# Patient Record
Sex: Female | Born: 1965
Health system: Southern US, Community
[De-identification: ages and names within clinical notes are randomized; demographics above are authoritative.]

## PROBLEM LIST (undated history)

## (undated) DIAGNOSIS — Z9889 Other specified postprocedural states: Secondary | ICD-10-CM

## (undated) DIAGNOSIS — M797 Fibromyalgia: Secondary | ICD-10-CM

## (undated) DIAGNOSIS — D649 Anemia, unspecified: Secondary | ICD-10-CM

## (undated) DIAGNOSIS — Z973 Presence of spectacles and contact lenses: Secondary | ICD-10-CM

## (undated) DIAGNOSIS — M1990 Unspecified osteoarthritis, unspecified site: Secondary | ICD-10-CM

## (undated) DIAGNOSIS — C50919 Malignant neoplasm of unspecified site of unspecified female breast: Secondary | ICD-10-CM

## (undated) DIAGNOSIS — F419 Anxiety disorder, unspecified: Secondary | ICD-10-CM

## (undated) DIAGNOSIS — R112 Nausea with vomiting, unspecified: Secondary | ICD-10-CM

## (undated) DIAGNOSIS — K219 Gastro-esophageal reflux disease without esophagitis: Secondary | ICD-10-CM

## (undated) HISTORY — PX: TONSILLECTOMY: SUR1361

## (undated) HISTORY — DX: Malignant neoplasm of unspecified site of unspecified female breast: C50.919

## (undated) HISTORY — PX: TUBAL LIGATION: SHX77

---

## 1998-01-08 ENCOUNTER — Other Ambulatory Visit: Admission: RE | Admit: 1998-01-08 | Discharge: 1998-01-08 | Payer: Self-pay | Admitting: Obstetrics & Gynecology

## 1998-11-19 ENCOUNTER — Other Ambulatory Visit: Admission: RE | Admit: 1998-11-19 | Discharge: 1998-11-19 | Payer: Self-pay | Admitting: Obstetrics & Gynecology

## 1999-12-28 ENCOUNTER — Other Ambulatory Visit: Admission: RE | Admit: 1999-12-28 | Discharge: 1999-12-28 | Payer: Self-pay | Admitting: Obstetrics & Gynecology

## 2001-01-16 ENCOUNTER — Other Ambulatory Visit: Admission: RE | Admit: 2001-01-16 | Discharge: 2001-01-16 | Payer: Self-pay | Admitting: Obstetrics & Gynecology

## 2001-10-02 ENCOUNTER — Inpatient Hospital Stay (HOSPITAL_COMMUNITY): Admission: AD | Admit: 2001-10-02 | Discharge: 2001-10-05 | Payer: Self-pay | Admitting: Obstetrics and Gynecology

## 2001-10-08 ENCOUNTER — Inpatient Hospital Stay (HOSPITAL_COMMUNITY): Admission: AD | Admit: 2001-10-08 | Discharge: 2001-10-08 | Payer: Self-pay | Admitting: *Deleted

## 2001-11-06 ENCOUNTER — Other Ambulatory Visit: Admission: RE | Admit: 2001-11-06 | Discharge: 2001-11-06 | Payer: Self-pay | Admitting: Obstetrics & Gynecology

## 2002-11-21 ENCOUNTER — Other Ambulatory Visit: Admission: RE | Admit: 2002-11-21 | Discharge: 2002-11-21 | Payer: Self-pay | Admitting: Obstetrics & Gynecology

## 2003-12-16 ENCOUNTER — Other Ambulatory Visit: Admission: RE | Admit: 2003-12-16 | Discharge: 2003-12-16 | Payer: Self-pay | Admitting: Obstetrics & Gynecology

## 2005-01-06 ENCOUNTER — Other Ambulatory Visit: Admission: RE | Admit: 2005-01-06 | Discharge: 2005-01-06 | Payer: Self-pay | Admitting: Obstetrics & Gynecology

## 2006-08-24 ENCOUNTER — Ambulatory Visit (HOSPITAL_COMMUNITY): Admission: RE | Admit: 2006-08-24 | Discharge: 2006-08-24 | Payer: Self-pay | Admitting: Obstetrics & Gynecology

## 2014-02-04 DIAGNOSIS — C50212 Malignant neoplasm of upper-inner quadrant of left female breast: Secondary | ICD-10-CM | POA: Insufficient documentation

## 2014-02-04 DIAGNOSIS — C50919 Malignant neoplasm of unspecified site of unspecified female breast: Secondary | ICD-10-CM

## 2014-02-04 HISTORY — DX: Malignant neoplasm of unspecified site of unspecified female breast: C50.919

## 2014-02-14 ENCOUNTER — Other Ambulatory Visit: Payer: Self-pay | Admitting: Obstetrics & Gynecology

## 2014-02-14 DIAGNOSIS — R928 Other abnormal and inconclusive findings on diagnostic imaging of breast: Secondary | ICD-10-CM

## 2014-02-24 ENCOUNTER — Other Ambulatory Visit: Payer: Self-pay | Admitting: Obstetrics & Gynecology

## 2014-02-24 DIAGNOSIS — R928 Other abnormal and inconclusive findings on diagnostic imaging of breast: Secondary | ICD-10-CM

## 2014-02-25 ENCOUNTER — Ambulatory Visit
Admission: RE | Admit: 2014-02-25 | Discharge: 2014-02-25 | Disposition: A | Discharge status: Home / Self Care | Payer: Medicaid Other | Source: Ambulatory Visit | Attending: Obstetrics & Gynecology | Admitting: Obstetrics & Gynecology

## 2014-02-25 ENCOUNTER — Other Ambulatory Visit: Payer: Self-pay | Admitting: Obstetrics & Gynecology

## 2014-02-25 DIAGNOSIS — R928 Other abnormal and inconclusive findings on diagnostic imaging of breast: Secondary | ICD-10-CM

## 2014-02-26 ENCOUNTER — Ambulatory Visit
Admission: RE | Admit: 2014-02-26 | Discharge: 2014-02-26 | Disposition: A | Discharge status: Home / Self Care | Payer: Medicaid Other | Source: Ambulatory Visit | Attending: Obstetrics & Gynecology | Admitting: Obstetrics & Gynecology

## 2014-02-26 ENCOUNTER — Other Ambulatory Visit: Payer: Self-pay | Admitting: Obstetrics & Gynecology

## 2014-02-26 DIAGNOSIS — C50912 Malignant neoplasm of unspecified site of left female breast: Secondary | ICD-10-CM

## 2014-02-26 DIAGNOSIS — R928 Other abnormal and inconclusive findings on diagnostic imaging of breast: Secondary | ICD-10-CM

## 2014-02-28 ENCOUNTER — Other Ambulatory Visit (INDEPENDENT_AMBULATORY_CARE_PROVIDER_SITE_OTHER): Payer: Self-pay | Admitting: General Surgery

## 2014-02-28 ENCOUNTER — Other Ambulatory Visit: Payer: Self-pay

## 2014-02-28 DIAGNOSIS — C50912 Malignant neoplasm of unspecified site of left female breast: Secondary | ICD-10-CM

## 2014-03-04 ENCOUNTER — Ambulatory Visit
Admission: RE | Admit: 2014-03-04 | Discharge: 2014-03-04 | Disposition: A | Discharge status: Home / Self Care | Payer: Medicaid Other | Source: Ambulatory Visit | Attending: Obstetrics & Gynecology | Admitting: Obstetrics & Gynecology

## 2014-03-04 DIAGNOSIS — C50912 Malignant neoplasm of unspecified site of left female breast: Secondary | ICD-10-CM

## 2014-03-04 MED ORDER — GADOBENATE DIMEGLUMINE 529 MG/ML IV SOLN
10.0000 mL | Freq: Once | INTRAVENOUS | Status: AC | PRN
  Administered 2014-03-04: 10 mL via INTRAVENOUS

## 2014-03-05 ENCOUNTER — Other Ambulatory Visit: Payer: Self-pay | Admitting: Obstetrics & Gynecology

## 2014-03-05 DIAGNOSIS — R928 Other abnormal and inconclusive findings on diagnostic imaging of breast: Secondary | ICD-10-CM

## 2014-03-06 ENCOUNTER — Other Ambulatory Visit (INDEPENDENT_AMBULATORY_CARE_PROVIDER_SITE_OTHER): Payer: Self-pay | Admitting: General Surgery

## 2014-03-06 DIAGNOSIS — C50912 Malignant neoplasm of unspecified site of left female breast: Secondary | ICD-10-CM

## 2014-03-07 ENCOUNTER — Other Ambulatory Visit: Payer: Medicaid Other

## 2014-03-10 ENCOUNTER — Ambulatory Visit
Admission: RE | Admit: 2014-03-10 | Discharge: 2014-03-10 | Disposition: A | Discharge status: Home / Self Care | Payer: BC Managed Care – PPO | Source: Ambulatory Visit | Attending: Obstetrics & Gynecology | Admitting: Obstetrics & Gynecology

## 2014-03-10 ENCOUNTER — Ambulatory Visit
Admission: RE | Admit: 2014-03-10 | Discharge: 2014-03-10 | Disposition: A | Discharge status: Home / Self Care | Payer: Medicaid Other | Source: Ambulatory Visit | Attending: Obstetrics & Gynecology | Admitting: Obstetrics & Gynecology

## 2014-03-10 DIAGNOSIS — R928 Other abnormal and inconclusive findings on diagnostic imaging of breast: Secondary | ICD-10-CM

## 2014-03-10 MED ORDER — GADOBENATE DIMEGLUMINE 529 MG/ML IV SOLN
10.0000 mL | Freq: Once | INTRAVENOUS | Status: AC | PRN
  Administered 2014-03-10: 10 mL via INTRAVENOUS

## 2014-03-11 ENCOUNTER — Telehealth (INDEPENDENT_AMBULATORY_CARE_PROVIDER_SITE_OTHER): Payer: Self-pay

## 2014-03-11 NOTE — Telephone Encounter (Signed)
I called this pt. She is still trying to decided on lumpectomy X 2 or mastectomy. She would like you to call her so she can go over everything with you. If you try and find your office notes in allscripts she has a different last name. It is [name] in our system. Her number is [phone].

## 2014-03-14 ENCOUNTER — Other Ambulatory Visit (INDEPENDENT_AMBULATORY_CARE_PROVIDER_SITE_OTHER): Payer: Self-pay

## 2014-03-14 MED ORDER — TAMOXIFEN CITRATE 10 MG PO TABS: 10.0000 mg | ORAL_TABLET | Freq: Two times a day (BID) | ORAL | Status: DC

## 2014-03-19 ENCOUNTER — Encounter (HOSPITAL_BASED_OUTPATIENT_CLINIC_OR_DEPARTMENT_OTHER): Admission: RE | Payer: Self-pay | Source: Ambulatory Visit

## 2014-03-19 ENCOUNTER — Ambulatory Visit (HOSPITAL_BASED_OUTPATIENT_CLINIC_OR_DEPARTMENT_OTHER): Admission: RE | Admit: 2014-03-19 | Payer: Medicaid Other | Source: Ambulatory Visit | Admitting: General Surgery

## 2014-03-19 ENCOUNTER — Ambulatory Visit (HOSPITAL_COMMUNITY): Payer: Medicaid Other

## 2014-03-19 SURGERY — BREAST LUMPECTOMY WITH RADIOACTIVE SEED LOCALIZATION
Anesthesia: General | Laterality: Left

## 2014-03-21 ENCOUNTER — Telehealth: Payer: Self-pay | Admitting: Genetic Counselor

## 2014-03-21 NOTE — Telephone Encounter (Signed)
S/W PATIENT AND GAVE GENETIC APPT FOR 10/26 @ 2 W/KAREN POWELL.  REFERRING DR. TOTH DX- GENETIC

## 2014-03-28 ENCOUNTER — Other Ambulatory Visit (INDEPENDENT_AMBULATORY_CARE_PROVIDER_SITE_OTHER): Payer: Self-pay | Admitting: General Surgery

## 2014-03-28 DIAGNOSIS — C50912 Malignant neoplasm of unspecified site of left female breast: Secondary | ICD-10-CM

## 2014-03-31 ENCOUNTER — Encounter: Payer: Self-pay | Admitting: Genetic Counselor

## 2014-03-31 ENCOUNTER — Ambulatory Visit (HOSPITAL_BASED_OUTPATIENT_CLINIC_OR_DEPARTMENT_OTHER): Payer: BC Managed Care – PPO | Admitting: Genetic Counselor

## 2014-03-31 ENCOUNTER — Other Ambulatory Visit: Payer: BC Managed Care – PPO

## 2014-03-31 DIAGNOSIS — Z803 Family history of malignant neoplasm of breast: Secondary | ICD-10-CM

## 2014-03-31 DIAGNOSIS — Z315 Encounter for genetic counseling: Secondary | ICD-10-CM

## 2014-03-31 DIAGNOSIS — C50911 Malignant neoplasm of unspecified site of right female breast: Secondary | ICD-10-CM

## 2014-03-31 NOTE — Progress Notes (Signed)
Dr.  Marlou [name], Julie Hitch, MD requested a consultation for genetic counseling and risk assessment for Julie [name], a 48 y.o. female, for discussion of her personal and family history of breast cancer.  She presents to clinic today to discuss the possibility of a genetic predisposition to cancer, and to further clarify her risks, as well as her family members' risks for cancer.   HISTORY OF PRESENT ILLNESS: In September 2015, at the age of 53, Julie [name] was diagnosed with invasive ductal carcinoma of the right breast. The tumor is ER+/PR+/Her2-.  This will be treated with double mastectomy.    Past Medical History  Diagnosis Date  . Breast cancer 02/2014    ER+/PR+/Her2-    History reviewed. No pertinent past surgical history.  History   Social History  . Marital Status: Married    Spouse Name: N/A    Number of Children: 1  . Years of Education: N/A   Social History Main Topics  . Smoking status: Never Smoker   . Smokeless tobacco: None  . Alcohol Use: Yes     Comment: occ  . Drug Use: None  . Sexual Activity: Yes   Other Topics Concern  . None   Social History Narrative  . None    REPRODUCTIVE HISTORY AND PERSONAL RISK ASSESSMENT FACTORS: Menarche was at age 53.   premenopausal Uterus Intact: yes Ovaries Intact: yes G1P1A0, first live birth at age 38  She has not previously undergone treatment for infertility.   Oral Contraceptive use: 15 years   She has not used HRT in the past.    FAMILY HISTORY:  We obtained a detailed, 4-generation family history.  Significant diagnoses are listed below: Family History  Problem Relation Age of Onset  . Breast cancer Paternal Aunt     dx in her 25s  . Stroke Maternal Grandmother   . Bone cancer Maternal Grandfather   . Breast cancer Paternal Grandmother 54  . Stroke Paternal Grandfather   . Breast cancer Paternal Aunt     dx in her 87s    Patient's maternal ancestors are of Caucasian descent, and paternal  ancestors are of [place] descent. There is no reported Ashkenazi Jewish ancestry. There is no known consanguinity.  GENETIC COUNSELING ASSESSMENT: Julie [name] is a 48 y.o. female with a personal and family history of breast cancer which somewhat suggestive of a hereditary cancer syndrome and predisposition to cancer. We, therefore, discussed and recommended the following at today's visit.   DISCUSSION: We reviewed the characteristics, features and inheritance patterns of hereditary cancer syndromes. We also discussed genetic testing, including the appropriate family members to test, the process of testing, insurance coverage and turn-around-time for results. We reviewed her family history and discussed hereditary cancer syndromes.  We looked at different cancer panels based on more researched and listed in NCCN guidelines vs. Less researched and not listed in NCCN.  We reviewed pros and cons for limited testing vs. More testing.    In order to estimate her chance of having a BRCA mutation, we used statistical models ([place] II and [place]) and laboratory data that take into account her personal medical history, family history and ancestry.  Because each model is different, there can be a lot of variability in the risks they give.  Therefore, these numbers must be considered a rough range and not a precise risk of having a BRCA mutation.  These models estimate that she has approximately a 21% chance  of having a mutation. Based on this assessment of her family and personal history, genetic testing is recommended.  PLAN: After considering the risks, benefits, and limitations, Julie [name] provided informed consent to pursue genetic testing and the blood sample will be sent to [facility] for analysis of the Breast/Ovarian Cancer Syndrome. We discussed the implications of a positive, negative and/ or variant of uncertain significance genetic test result. Results should be available  within approximately 2-3 weeks' time, at which point they will be disclosed by telephone to Julie [name], as will any additional recommendations warranted by these results. Julie [name] will receive a summary of her genetic counseling visit and a copy of her results once available. This information will also be available in Epic. We encouraged Julie [name] to remain in contact with cancer genetics annually so that we can continuously update the family history and inform her of any changes in cancer genetics and testing that may be of benefit for her family. Julie [name]'s questions were answered to her satisfaction today. Our contact information was provided should additional questions or concerns arise.  The patient was seen for a total of 60 minutes, greater than 50% of which was spent face-to-face counseling.  This note will also be sent to the referring provider via the electronic medical record. The patient will be supplied with a summary of this genetic counseling discussion as well as educational information on the discussed hereditary cancer syndromes following the conclusion of their visit.     _______________________________________________________________________ For Office Staff:  Number of people involved in session: 2 Was an Intern/ student involved with case: no

## 2014-04-04 NOTE — H&P (Signed)
  Subjective:   Patient ID: Julie [name] is a 48 y.o. female.  HPI   Patient referred by Dr. Marlou Starks for evaluation for breast reconstruction. Patient presented following MMG with left breast 7 mm mass at 11 o clock, posterior third. [place] concordant and biospy demonstrated IDC. Underwent MRI and R breast benign, left with additional oval mass at 11 o clock middle third. Bx of second area IDC ER/PR+ Her 2 -. Patient initially scheduled for lumpectomy, this was canceled in part due to insurance issues. She has also had further dicussion with Dr. Marlou Starks regarding her family history breast cancer and plan at this time is bilateral mastectomies. Has genetic testing scheduled for next week  Current 36 C, deisred Full C  Weight fluctuates within 10 lb of current  Review of Systems  Musculoskeletal: Positive for myalgias and arthralgias.  Psychiatric/Behavioral: The patient is nervous/anxious.  All other systems reviewed and are negative.    Objective:   Physical Exam  Cardiovascular: Normal rate.  Pulmonary/Chest: Effort normal.  Abdominal: Soft.  Genitourinary: No breast discharge.  Grade 1 ptosis bilat SN to nipple R 24 cm L 24 cm BW R 14 L 15 cm Nipple to IMF R 8.5 L 8.5 cm  No axillary adenopathy  Skin:  Otho Ket 2    Assessment:    Left breast cancer   Plan:    Reviewed immediate vs delayed breast reconstruction, autologous vs implant based. Given the small amount abdominal tissue she has and bilateral reconstruction, she would have small volume breasts from autologous reconstruction alone and likely want implant in addition. Discussed staged nature of reconstruction, multiple procedures. Reviewed risks of implants, expanders including rupture, extrusion, contracture, infection, rotation. Reviewed overnight hospital stay at least, drains, post procedure visits and limitations. Reviewed timing of secondary surgeries will depend on any adjuvant treatments. Reviewed nipple spring risks  including loss nipple need for further surgery for debridement and eventual NAC reconstruction.  Plan reconstruction with tissue expanders and acellular dermis. Pictures taken and examples of expanders, implants reviewed.  Irene Limbo, MD [facility] Plastic & Reconstructive Surgery [phone]

## 2014-04-07 ENCOUNTER — Telehealth: Payer: Self-pay | Admitting: *Deleted

## 2014-04-07 NOTE — Telephone Encounter (Signed)
Received referral from [facility].  Called pt and confirmed 04/29/14 appt w/ her. Mailed before appt letter, welcoming packet & intake form to pt.  Emailed Engineer, civil (consulting) at [employer] to make her aware.

## 2014-04-08 ENCOUNTER — Encounter (HOSPITAL_COMMUNITY): Payer: Self-pay

## 2014-04-08 ENCOUNTER — Encounter (HOSPITAL_COMMUNITY)
Admission: RE | Admit: 2014-04-08 | Discharge: 2014-04-08 | Disposition: A | Discharge status: Home / Self Care | Payer: BC Managed Care – PPO | Source: Ambulatory Visit | Attending: General Surgery | Admitting: General Surgery

## 2014-04-08 DIAGNOSIS — F329 Major depressive disorder, single episode, unspecified: Secondary | ICD-10-CM | POA: Diagnosis not present

## 2014-04-08 DIAGNOSIS — Z803 Family history of malignant neoplasm of breast: Secondary | ICD-10-CM | POA: Diagnosis not present

## 2014-04-08 DIAGNOSIS — Z885 Allergy status to narcotic agent status: Secondary | ICD-10-CM | POA: Diagnosis not present

## 2014-04-08 DIAGNOSIS — M1990 Unspecified osteoarthritis, unspecified site: Secondary | ICD-10-CM | POA: Diagnosis not present

## 2014-04-08 DIAGNOSIS — F419 Anxiety disorder, unspecified: Secondary | ICD-10-CM | POA: Diagnosis not present

## 2014-04-08 DIAGNOSIS — C50912 Malignant neoplasm of unspecified site of left female breast: Secondary | ICD-10-CM | POA: Diagnosis not present

## 2014-04-08 HISTORY — DX: Fibromyalgia: M79.7

## 2014-04-08 HISTORY — DX: Other specified postprocedural states: Z98.890

## 2014-04-08 HISTORY — DX: Unspecified osteoarthritis, unspecified site: M19.90

## 2014-04-08 HISTORY — DX: Nausea with vomiting, unspecified: R11.2

## 2014-04-08 LAB — BASIC METABOLIC PANEL
ANION GAP: 11 (ref 5–15)
BUN: 9 mg/dL (ref 6–23)
CALCIUM: 8.7 mg/dL (ref 8.4–10.5)
CO2: 25 mEq/L (ref 19–32)
CREATININE: 0.95 mg/dL (ref 0.50–1.10)
Chloride: 104 mEq/L (ref 96–112)
GFR calc non Af Amer: 71 mL/min — ABNORMAL LOW (ref >90)
GFR, EST AFRICAN AMERICAN: 82 mL/min — AB (ref >90)
Glucose, Bld: 136 mg/dL — ABNORMAL HIGH (ref 70–99)
Potassium: 3.9 mEq/L (ref 3.7–5.3)
Sodium: 140 mEq/L (ref 137–147)

## 2014-04-08 LAB — CBC
HCT: 37.8 % (ref 36.0–46.0)
Hemoglobin: 12.3 g/dL (ref 12.0–15.0)
MCH: 27.1 pg (ref 26.0–34.0)
MCHC: 32.5 g/dL (ref 30.0–36.0)
MCV: 83.3 fL (ref 78.0–100.0)
Platelets: 172 10*3/uL (ref 150–400)
RBC: 4.54 MIL/uL (ref 3.87–5.11)
RDW: 14.4 % (ref 11.5–15.5)
WBC: 5.3 10*3/uL (ref 4.0–10.5)

## 2014-04-08 LAB — HCG, SERUM, QUALITATIVE: PREG SERUM: NEGATIVE

## 2014-04-08 NOTE — Pre-Procedure Instructions (Addendum)
Julie [name]  04/08/2014   Your procedure is scheduled on:  04/11/14  Report to [facility] short stay admitting at 1045 AM.  Call this number if you have problems the morning of surgery: [phone]   Remember:   Do not eat food or drink liquids after midnight.   Take these medicines the morning of surgery with A SIP OF WATER: prozac, tamoxifen, claritin if needed      STOP all herbel meds, nsaids (aleve,naproxen,advil,ibuprofen) now including aspirin, vitamins   Do not wear jewelry, make-up or nail polish.  Do not wear lotions, powders, or perfumes. You may wear deodorant.  Do not shave 48 hours prior to surgery. Men may shave face and neck.  Do not bring valuables to the hospital.  [facility] is not responsible                  for any belongings or valuables.               Contacts, dentures or bridgework may not be worn into surgery.  Leave suitcase in the car. After surgery it may be brought to your room.  For patients admitted to the hospital, discharge time is determined by your                treatment team.               Patients discharged the day of surgery will not be allowed to drive  home.  Name and phone number of your driver:   Special Instructions:  Special Instructions: [facility] - Preparing for Surgery  Before surgery, you can play an important role.  Because skin is not sterile, your skin needs to be as free of germs as possible.  You can reduce the number of germs on you skin by washing with CHG (chlorahexidine gluconate) soap before surgery.  CHG is an antiseptic cleaner which kills germs and bonds with the skin to continue killing germs even after washing.  Please DO NOT use if you have an allergy to CHG or antibacterial soaps.  If your skin becomes reddened/irritated stop using the CHG and inform your nurse when you arrive at Short Stay.  Do not shave (including legs and underarms) for at least 48 hours prior to the first CHG shower.  You may shave your  face.  Please follow these instructions carefully:   1.  Shower with CHG Soap the night before surgery and the morning of Surgery.  2.  If you choose to wash your hair, wash your hair first as usual with your normal shampoo.  3.  After you shampoo, rinse your hair and body thoroughly to remove the Shampoo.  4.  Use CHG as you would any other liquid soap.  You can apply chg directly  to the skin and wash gently with scrungie or a clean washcloth.  5.  Apply the CHG Soap to your body ONLY FROM THE NECK DOWN.  Do not use on open wounds or open sores.  Avoid contact with your eyes ears, mouth and genitals (private parts).  Wash genitals (private parts)       with your normal soap.  6.  Wash thoroughly, paying special attention to the area where your surgery will be performed.  7.  Thoroughly rinse your body with warm water from the neck down.  8.  DO NOT shower/wash with your normal soap after using and rinsing off the CHG Soap.  9.  Julie [name]  yourself dry with a clean towel.            10.  Wear clean pajamas.            11.  Place clean sheets on your bed the night of your first shower and do not sleep with pets.  Day of Surgery  Do not apply any lotions/deodorants the morning of surgery.  Please wear clean clothes to the hospital/surgery center.   Please read over the following fact sheets that you were given: Pain Booklet, Coughing and Deep Breathing and Surgical Site Infection Prevention

## 2014-04-10 MED ORDER — CEFAZOLIN SODIUM-DEXTROSE 2-3 GM-% IV SOLR
2.0000 g | INTRAVENOUS | Status: AC
  Administered 2014-04-11 (×2): 2 g via INTRAVENOUS
  Filled 2014-04-10: qty 50

## 2014-04-11 ENCOUNTER — Encounter (HOSPITAL_COMMUNITY): Payer: Self-pay | Admitting: *Deleted

## 2014-04-11 ENCOUNTER — Encounter (HOSPITAL_COMMUNITY)
Admission: RE | Disposition: A | Discharge status: Home / Self Care | Payer: Self-pay | Source: Ambulatory Visit | Attending: General Surgery

## 2014-04-11 ENCOUNTER — Ambulatory Visit (HOSPITAL_COMMUNITY): Payer: BC Managed Care – PPO | Admitting: Anesthesiology

## 2014-04-11 ENCOUNTER — Observation Stay (HOSPITAL_COMMUNITY)
Admission: RE | Admit: 2014-04-11 | Discharge: 2014-04-13 | Disposition: A | Discharge status: Home / Self Care | Payer: BC Managed Care – PPO | Source: Ambulatory Visit | Attending: General Surgery | Admitting: General Surgery

## 2014-04-11 ENCOUNTER — Ambulatory Visit (HOSPITAL_COMMUNITY)
Admission: RE | Admit: 2014-04-11 | Discharge: 2014-04-11 | Disposition: A | Discharge status: Home / Self Care | Payer: BC Managed Care – PPO | Source: Ambulatory Visit | Attending: General Surgery | Admitting: General Surgery

## 2014-04-11 DIAGNOSIS — C50919 Malignant neoplasm of unspecified site of unspecified female breast: Secondary | ICD-10-CM | POA: Diagnosis present

## 2014-04-11 DIAGNOSIS — C50912 Malignant neoplasm of unspecified site of left female breast: Principal | ICD-10-CM | POA: Insufficient documentation

## 2014-04-11 DIAGNOSIS — Z803 Family history of malignant neoplasm of breast: Secondary | ICD-10-CM | POA: Insufficient documentation

## 2014-04-11 DIAGNOSIS — F419 Anxiety disorder, unspecified: Secondary | ICD-10-CM | POA: Insufficient documentation

## 2014-04-11 DIAGNOSIS — F329 Major depressive disorder, single episode, unspecified: Secondary | ICD-10-CM | POA: Insufficient documentation

## 2014-04-11 DIAGNOSIS — Z885 Allergy status to narcotic agent status: Secondary | ICD-10-CM | POA: Insufficient documentation

## 2014-04-11 DIAGNOSIS — M1990 Unspecified osteoarthritis, unspecified site: Secondary | ICD-10-CM | POA: Insufficient documentation

## 2014-04-11 HISTORY — PX: TOTAL MASTECTOMY: SHX6129

## 2014-04-11 HISTORY — PX: BREAST RECONSTRUCTION WITH PLACEMENT OF TISSUE EXPANDER AND FLEX HD (ACELLULAR HYDRATED DERMIS): SHX6295

## 2014-04-11 SURGERY — NIPPLE SPARING MASTECTOMY WITH SENTINAL LYMPH NODE BIOPSY AND  RECONSTRUCTION WITH PLACEMENT OF TISSUE EXPANDER
Anesthesia: General | Site: Breast | Laterality: Right

## 2014-04-11 MED ORDER — KCL IN DEXTROSE-NACL 20-5-0.9 MEQ/L-%-% IV SOLN
INTRAVENOUS | Status: DC
  Administered 2014-04-11 – 2014-04-12 (×3): via INTRAVENOUS
  Filled 2014-04-11 (×6): qty 1000

## 2014-04-11 MED ORDER — OXYCODONE-ACETAMINOPHEN 5-325 MG PO TABS
1.0000 | ORAL_TABLET | ORAL | Status: DC | PRN
  Administered 2014-04-12 – 2014-04-13 (×6): 2 via ORAL
  Filled 2014-04-11 (×6): qty 2

## 2014-04-11 MED ORDER — 0.9 % SODIUM CHLORIDE (POUR BTL) OPTIME
TOPICAL | Status: DC | PRN
  Administered 2014-04-11 (×2): 1000 mL

## 2014-04-11 MED ORDER — PROPOFOL 10 MG/ML IV BOLUS
INTRAVENOUS | Status: AC
  Filled 2014-04-11: qty 20

## 2014-04-11 MED ORDER — HYDROMORPHONE HCL 1 MG/ML IJ SOLN
0.2500 mg | INTRAMUSCULAR | Status: DC | PRN
  Administered 2014-04-11: 1 mg via INTRAVENOUS

## 2014-04-11 MED ORDER — ONDANSETRON HCL 4 MG/2ML IJ SOLN: 4.0000 mg | Freq: Four times a day (QID) | INTRAMUSCULAR | Status: DC | PRN

## 2014-04-11 MED ORDER — ONDANSETRON HCL 4 MG/2ML IJ SOLN
INTRAMUSCULAR | Status: AC
  Filled 2014-04-11: qty 2

## 2014-04-11 MED ORDER — TECHNETIUM TC 99M SULFUR COLLOID FILTERED: 1.0000 | Freq: Once | INTRAVENOUS | Status: AC | PRN

## 2014-04-11 MED ORDER — CHLORHEXIDINE GLUCONATE 4 % EX LIQD: 1.0000 "application " | Freq: Once | CUTANEOUS | Status: DC

## 2014-04-11 MED ORDER — ROCURONIUM BROMIDE 100 MG/10ML IV SOLN
INTRAVENOUS | Status: DC | PRN
  Administered 2014-04-11: 10 mg via INTRAVENOUS
  Administered 2014-04-11: 25 mg via INTRAVENOUS
  Administered 2014-04-11: 50 mg via INTRAVENOUS
  Administered 2014-04-11: 10 mg via INTRAVENOUS

## 2014-04-11 MED ORDER — LIDOCAINE HCL (CARDIAC) 20 MG/ML IV SOLN
INTRAVENOUS | Status: AC
  Filled 2014-04-11: qty 5

## 2014-04-11 MED ORDER — MIDAZOLAM HCL 5 MG/5ML IJ SOLN
INTRAMUSCULAR | Status: DC | PRN
  Administered 2014-04-11 (×2): 1 mg via INTRAVENOUS

## 2014-04-11 MED ORDER — ONDANSETRON HCL 4 MG PO TABS: 4.0000 mg | ORAL_TABLET | Freq: Four times a day (QID) | ORAL | Status: DC | PRN

## 2014-04-11 MED ORDER — MIDAZOLAM HCL 2 MG/2ML IJ SOLN
INTRAMUSCULAR | Status: AC
  Filled 2014-04-11: qty 2

## 2014-04-11 MED ORDER — HYDROMORPHONE HCL 1 MG/ML IJ SOLN
INTRAMUSCULAR | Status: AC
  Filled 2014-04-11: qty 1

## 2014-04-11 MED ORDER — KETOROLAC TROMETHAMINE 30 MG/ML IJ SOLN
15.0000 mg | Freq: Three times a day (TID) | INTRAMUSCULAR | Status: DC
Start: 2014-04-11 — End: 2014-04-13
  Administered 2014-04-11 – 2014-04-13 (×6): 15 mg via INTRAVENOUS
  Filled 2014-04-11 (×10): qty 1

## 2014-04-11 MED ORDER — PROMETHAZINE HCL 25 MG/ML IJ SOLN
INTRAMUSCULAR | Status: AC
  Administered 2014-04-11: 1205 mg
  Filled 2014-04-11: qty 1

## 2014-04-11 MED ORDER — FENTANYL CITRATE 0.05 MG/ML IJ SOLN: 100.0000 ug | Freq: Once | INTRAMUSCULAR | Status: DC

## 2014-04-11 MED ORDER — PROPOFOL 10 MG/ML IV BOLUS
INTRAVENOUS | Status: DC | PRN
  Administered 2014-04-11: 160 mg via INTRAVENOUS

## 2014-04-11 MED ORDER — SULFAMETHOXAZOLE-TRIMETHOPRIM 800-160 MG PO TABS: 1.0000 | ORAL_TABLET | Freq: Two times a day (BID) | ORAL | Status: DC

## 2014-04-11 MED ORDER — FLUOXETINE HCL 20 MG PO CAPS
20.0000 mg | ORAL_CAPSULE | Freq: Every day | ORAL | Status: DC
  Administered 2014-04-12 – 2014-04-13 (×2): 20 mg via ORAL
  Filled 2014-04-11 (×3): qty 1

## 2014-04-11 MED ORDER — FENTANYL CITRATE 0.05 MG/ML IJ SOLN
INTRAMUSCULAR | Status: AC
  Administered 2014-04-11: 100 ug
  Filled 2014-04-11: qty 2

## 2014-04-11 MED ORDER — HEPARIN SODIUM (PORCINE) 5000 UNIT/ML IJ SOLN
5000.0000 [IU] | Freq: Three times a day (TID) | INTRAMUSCULAR | Status: DC
  Administered 2014-04-12 – 2014-04-13 (×5): 5000 [IU] via SUBCUTANEOUS
  Filled 2014-04-11 (×7): qty 1

## 2014-04-11 MED ORDER — LACTATED RINGERS IV SOLN
INTRAVENOUS | Status: DC
  Administered 2014-04-11: 11:00:00 via INTRAVENOUS

## 2014-04-11 MED ORDER — DIAZEPAM 5 MG PO TABS
5.0000 mg | ORAL_TABLET | Freq: Three times a day (TID) | ORAL | Status: DC | PRN
  Administered 2014-04-12: 5 mg via ORAL
  Filled 2014-04-11: qty 1

## 2014-04-11 MED ORDER — CEFAZOLIN SODIUM-DEXTROSE 2-3 GM-% IV SOLR
INTRAVENOUS | Status: AC
  Filled 2014-04-11: qty 50

## 2014-04-11 MED ORDER — LORATADINE 10 MG PO TABS: 10.0000 mg | ORAL_TABLET | Freq: Every day | ORAL | Status: DC | PRN

## 2014-04-11 MED ORDER — METHYLENE BLUE 1 % INJ SOLN
INTRAMUSCULAR | Status: AC
  Filled 2014-04-11: qty 10

## 2014-04-11 MED ORDER — MIDAZOLAM HCL 5 MG/ML IJ SOLN
2.0000 mg | Freq: Once | INTRAMUSCULAR | Status: DC
Start: 2014-04-11 — End: 2014-04-13

## 2014-04-11 MED ORDER — GLYCOPYRROLATE 0.2 MG/ML IJ SOLN
INTRAMUSCULAR | Status: DC | PRN
  Administered 2014-04-11: 0.4 mg via INTRAVENOUS

## 2014-04-11 MED ORDER — CEFAZOLIN SODIUM 1-5 GM-% IV SOLN
1.0000 g | Freq: Three times a day (TID) | INTRAVENOUS | Status: AC
  Administered 2014-04-11 – 2014-04-12 (×3): 1 g via INTRAVENOUS
  Filled 2014-04-11 (×3): qty 50

## 2014-04-11 MED ORDER — FENTANYL CITRATE 0.05 MG/ML IJ SOLN
100.0000 ug | INTRAMUSCULAR | Status: DC | PRN
  Administered 2014-04-11 – 2014-04-12 (×3): 100 ug via INTRAVENOUS
  Filled 2014-04-11 (×3): qty 2

## 2014-04-11 MED ORDER — SCOPOLAMINE 1 MG/3DAYS TD PT72
1.0000 | MEDICATED_PATCH | TRANSDERMAL | Status: DC
  Administered 2014-04-11: 1.5 mg via TRANSDERMAL

## 2014-04-11 MED ORDER — FENTANYL CITRATE 0.05 MG/ML IJ SOLN
INTRAMUSCULAR | Status: AC
  Filled 2014-04-11: qty 5

## 2014-04-11 MED ORDER — MORPHINE SULFATE 4 MG/ML IJ SOLN
INTRAMUSCULAR | Status: AC
  Filled 2014-04-11: qty 1

## 2014-04-11 MED ORDER — LACTATED RINGERS IV SOLN
INTRAVENOUS | Status: DC | PRN
  Administered 2014-04-11 (×3): via INTRAVENOUS

## 2014-04-11 MED ORDER — ROCURONIUM BROMIDE 50 MG/5ML IV SOLN
INTRAVENOUS | Status: AC
  Filled 2014-04-11: qty 1

## 2014-04-11 MED ORDER — HYDROMORPHONE HCL 1 MG/ML IJ SOLN
INTRAMUSCULAR | Status: DC | PRN
  Administered 2014-04-11 (×2): 0.5 mg via INTRAVENOUS

## 2014-04-11 MED ORDER — OXYCODONE HCL 5 MG PO TABS: 5.0000 mg | ORAL_TABLET | Freq: Once | ORAL | Status: DC | PRN

## 2014-04-11 MED ORDER — SCOPOLAMINE 1 MG/3DAYS TD PT72
MEDICATED_PATCH | TRANSDERMAL | Status: AC
  Filled 2014-04-11: qty 1

## 2014-04-11 MED ORDER — FENTANYL CITRATE 0.05 MG/ML IJ SOLN
INTRAMUSCULAR | Status: DC | PRN
  Administered 2014-04-11 (×2): 50 ug via INTRAVENOUS
  Administered 2014-04-11: 100 ug via INTRAVENOUS
  Administered 2014-04-11: 50 ug via INTRAVENOUS

## 2014-04-11 MED ORDER — PHENYLEPHRINE HCL 10 MG/ML IJ SOLN
INTRAMUSCULAR | Status: DC | PRN
  Administered 2014-04-11: 80 ug via INTRAVENOUS
  Administered 2014-04-11: 40 ug via INTRAVENOUS
  Administered 2014-04-11: 80 ug via INTRAVENOUS

## 2014-04-11 MED ORDER — LIDOCAINE HCL (CARDIAC) 20 MG/ML IV SOLN
INTRAVENOUS | Status: DC | PRN
  Administered 2014-04-11: 80 mg via INTRAVENOUS

## 2014-04-11 MED ORDER — KETOROLAC TROMETHAMINE 30 MG/ML IJ SOLN
INTRAMUSCULAR | Status: AC
  Filled 2014-04-11: qty 1

## 2014-04-11 MED ORDER — SODIUM CHLORIDE 0.9 % IV SOLN
Freq: Once | INTRAVENOUS | Status: AC
  Administered 2014-04-11: 1000 mL
  Filled 2014-04-11: qty 1

## 2014-04-11 MED ORDER — DIAZEPAM 5 MG PO TABS: 5.0000 mg | ORAL_TABLET | Freq: Three times a day (TID) | ORAL | Status: DC | PRN

## 2014-04-11 MED ORDER — NEOSTIGMINE METHYLSULFATE 10 MG/10ML IV SOLN
INTRAVENOUS | Status: DC | PRN
  Administered 2014-04-11: 3 mg via INTRAVENOUS

## 2014-04-11 MED ORDER — MIDAZOLAM HCL 2 MG/2ML IJ SOLN
INTRAMUSCULAR | Status: AC
  Administered 2014-04-11: 2 mg
  Filled 2014-04-11: qty 2

## 2014-04-11 MED ORDER — MIDAZOLAM HCL 5 MG/ML IJ SOLN: 2.0000 mg | Freq: Once | INTRAMUSCULAR | Status: DC

## 2014-04-11 MED ORDER — TAMOXIFEN CITRATE 10 MG PO TABS
10.0000 mg | ORAL_TABLET | Freq: Two times a day (BID) | ORAL | Status: DC
  Administered 2014-04-12 – 2014-04-13 (×3): 10 mg via ORAL
  Filled 2014-04-11 (×5): qty 1

## 2014-04-11 MED ORDER — OXYCODONE HCL 5 MG/5ML PO SOLN: 5.0000 mg | Freq: Once | ORAL | Status: DC | PRN

## 2014-04-11 SURGICAL SUPPLY — 82 items
ADH SKN CLS APL DERMABOND .7 (GAUZE/BANDAGES/DRESSINGS) ×9
APPLIER CLIP 9.375 MED OPEN (MISCELLANEOUS) ×4
APR CLP MED 9.3 20 MLT OPN (MISCELLANEOUS) ×3
BAG DECANTER FOR FLEXI CONT (MISCELLANEOUS) ×4 IMPLANT
BINDER BREAST LRG (GAUZE/BANDAGES/DRESSINGS) ×1 IMPLANT
BINDER BREAST XLRG (GAUZE/BANDAGES/DRESSINGS) IMPLANT
BLADE SURG 10 STRL SS (BLADE) ×4 IMPLANT
CANISTER SUCTION 2500CC (MISCELLANEOUS) ×9 IMPLANT
CHLORAPREP W/TINT 26ML (MISCELLANEOUS) ×8 IMPLANT
CLIP APPLIE 9.375 MED OPEN (MISCELLANEOUS) ×3 IMPLANT
CONT SPEC 4OZ CLIKSEAL STRL BL (MISCELLANEOUS) ×9 IMPLANT
COVER PROBE W GEL 5X96 (DRAPES) ×4 IMPLANT
COVER SURGICAL LIGHT HANDLE (MISCELLANEOUS) ×8 IMPLANT
DERMABOND ADVANCED (GAUZE/BANDAGES/DRESSINGS) ×3
DERMABOND ADVANCED .7 DNX12 (GAUZE/BANDAGES/DRESSINGS) ×9 IMPLANT
DEVICE DISSECT PLASMABLAD 3.0S (MISCELLANEOUS) ×3 IMPLANT
DRAIN CHANNEL 19F RND (DRAIN) ×8 IMPLANT
DRAPE LAPAROSCOPIC ABDOMINAL (DRAPES) ×4 IMPLANT
DRAPE ORTHO SPLIT 77X108 STRL (DRAPES) ×8
DRAPE PROXIMA HALF (DRAPES) ×8 IMPLANT
DRAPE SURG ORHT 6 SPLT 77X108 (DRAPES) ×6 IMPLANT
DRAPE UTILITY 15X26 W/TAPE STR (DRAPE) ×8 IMPLANT
DRAPE WARM FLUID 44X44 (DRAPE) ×4 IMPLANT
DRSG PAD ABDOMINAL 8X10 ST (GAUZE/BANDAGES/DRESSINGS) ×10 IMPLANT
DRSG TEGADERM 4X4.75 (GAUZE/BANDAGES/DRESSINGS) ×13 IMPLANT
ELECT BLADE 4.0 EZ CLEAN MEGAD (MISCELLANEOUS) ×4
ELECT CAUTERY BLADE 6.4 (BLADE) ×4 IMPLANT
ELECT COATED BLADE 2.86 ST (ELECTRODE) ×4 IMPLANT
ELECT REM PT RETURN 9FT ADLT (ELECTROSURGICAL) ×4
ELECTRODE BLDE 4.0 EZ CLN MEGD (MISCELLANEOUS) ×3 IMPLANT
ELECTRODE REM PT RTRN 9FT ADLT (ELECTROSURGICAL) ×6 IMPLANT
EVACUATOR SILICONE 100CC (DRAIN) ×8 IMPLANT
GAUZE SPONGE 4X4 12PLY STRL (GAUZE/BANDAGES/DRESSINGS) ×8 IMPLANT
GAUZE XEROFORM 5X9 LF (GAUZE/BANDAGES/DRESSINGS) ×4 IMPLANT
GLOVE BIO SURGEON STRL SZ 6 (GLOVE) ×7 IMPLANT
GLOVE BIO SURGEON STRL SZ7.5 (GLOVE) ×4 IMPLANT
GLOVE BIOGEL PI IND STRL 7.0 (GLOVE) IMPLANT
GLOVE BIOGEL PI INDICATOR 7.0 (GLOVE) ×1
GLOVE SURG SS PI 6.0 STRL IVOR (GLOVE) ×4 IMPLANT
GOWN STRL REUS W/ TWL LRG LVL3 (GOWN DISPOSABLE) ×12 IMPLANT
GOWN STRL REUS W/TWL LRG LVL3 (GOWN DISPOSABLE) ×16
GRAFT FLEX HD BILAT 4X16 THICK (Tissue Mesh) ×1 IMPLANT
IMPL BREAST ARTOURA 375CC (Breast) IMPLANT
IMPLANT BREAST ARTOURA 375CC (Breast) ×8 IMPLANT
KIT BASIN OR (CUSTOM PROCEDURE TRAY) ×8 IMPLANT
KIT ROOM TURNOVER OR (KITS) ×8 IMPLANT
NDL 18GX1X1/2 (RX/OR ONLY) (NEEDLE) IMPLANT
NDL HYPO 25GX1X1/2 BEV (NEEDLE) IMPLANT
NEEDLE 18GX1X1/2 (RX/OR ONLY) (NEEDLE) IMPLANT
NEEDLE HYPO 25GX1X1/2 BEV (NEEDLE) IMPLANT
NS IRRIG 1000ML POUR BTL (IV SOLUTION) ×12 IMPLANT
PACK GENERAL/GYN (CUSTOM PROCEDURE TRAY) ×8 IMPLANT
PAD ARMBOARD 7.5X6 YLW CONV (MISCELLANEOUS) ×8 IMPLANT
PIN SAFETY STERILE (MISCELLANEOUS) ×4 IMPLANT
PLASMABLADE 3.0S (MISCELLANEOUS) ×4
SET ASEPTIC TRANSFER (MISCELLANEOUS) ×4 IMPLANT
SOLUTION BETADINE 4OZ (MISCELLANEOUS) ×4 IMPLANT
SPECIMEN JAR X LARGE (MISCELLANEOUS) ×4 IMPLANT
SPONGE LAP 18X18 X RAY DECT (DISPOSABLE) ×1 IMPLANT
STAPLER VISISTAT 35W (STAPLE) IMPLANT
SUT ETHILON 2 0 FS 18 (SUTURE) ×8 IMPLANT
SUT ETHILON 3 0 FSL (SUTURE) ×3 IMPLANT
SUT MNCRL AB 4-0 PS2 18 (SUTURE) ×8 IMPLANT
SUT MON AB 4-0 PC3 18 (SUTURE) ×3 IMPLANT
SUT MON AB 5-0 PS2 18 (SUTURE) ×6 IMPLANT
SUT PDS AB 2-0 CT1 27 (SUTURE) IMPLANT
SUT SILK 3 0 SH 30 (SUTURE) IMPLANT
SUT VIC AB 3-0 54X BRD REEL (SUTURE) IMPLANT
SUT VIC AB 3-0 BRD 54 (SUTURE)
SUT VIC AB 3-0 PS2 18 (SUTURE)
SUT VIC AB 3-0 PS2 18XBRD (SUTURE) IMPLANT
SUT VIC AB 3-0 SH 18 (SUTURE) ×5 IMPLANT
SUT VIC AB 3-0 SH 27 (SUTURE) ×28
SUT VIC AB 3-0 SH 27X BRD (SUTURE) ×12 IMPLANT
SUT VICRYL 4-0 PS2 18IN ABS (SUTURE) ×8 IMPLANT
SYR CONTROL 10ML LL (SYRINGE) IMPLANT
TOWEL OR 17X24 6PK STRL BLUE (TOWEL DISPOSABLE) ×8 IMPLANT
TOWEL OR 17X26 10 PK STRL BLUE (TOWEL DISPOSABLE) ×8 IMPLANT
TRAY FOLEY CATH 16FRSI W/METER (SET/KITS/TRAYS/PACK) IMPLANT
TUBE CONNECTING 12X1/4 (SUCTIONS) ×4 IMPLANT
UNIVERSAL FILL KIT REF: 7M2804 ×1 IMPLANT
WINGED INFUSION SET REF: 7B3050 ×1 IMPLANT

## 2014-04-11 NOTE — Progress Notes (Signed)
Received patient from [facility] post breat surgery. Sedated, not distress, VSS, incision clean , dry and intact. Will continue to monitor patient.

## 2014-04-11 NOTE — Plan of Care (Signed)
Problem: Phase I Progression Outcomes Goal: Arm precautions in place Outcome: Completed/Met Date Met:  04/11/14

## 2014-04-11 NOTE — H&P (Signed)
Julie [name]. Julie [name] 02/28/2014 3:31 PM Location: [facility] Patient #: [id number] DOB: 1965/09/04 Divorced / Language: Julie [name] / Race: White Female  History of Present Illness Julie [name]. Julie Starks MD; 02/28/2014 4:50 PM) Patient words: eval breast.  The patient is a 48 year old female who presents with breast cancer. we are asked to see the patient in consultation by Dr. Radford [name] to evaluate her for a left breast cancer. The patient is a 48 year old white female who recently went for a routine screening mammogram at which time an abnormality was found in the 11:00 position of the left breast. She denied any breast pain. She denied any discharge from her nipple. She does not take any female hormones. This area was biopsied and came back as an invasive breast cancer. It measured 7 mm by ultrasound. Her MRI is scheduled for this Tuesday. She does have a family history of breast cancer on her father's side.   Other Problems Julie [name], CMA; 02/28/2014 3:33 PM) Anxiety Disorder Arthritis Breast Cancer Depression Lump In Breast  Past Surgical History Julie [name], Julie [name]; 02/28/2014 3:33 PM) Breast Biopsy Left. Oral Surgery Tonsillectomy  Diagnostic Studies History Julie [name], CMA; 02/28/2014 3:33 PM) Colonoscopy never Mammogram within last year Pap Smear 1-5 years ago  Allergies Julie [name], CMA; 02/28/2014 3:31 PM) Codeine Phosphate *ANALGESICS - OPIOID*  Medication History Julie [name], CMA; 02/28/2014 3:33 PM) FLUoxetine HCl (20MG  Capsule, Oral daily) Active.  Social History (Julie [name]; 02/28/2014 3:33 PM) Caffeine use Tea. No alcohol use No drug use Tobacco use Never smoker.  Family History Julie [name], [place]; 02/28/2014 3:33 PM) Arthritis Mother. Breast Cancer Family Members In General. Thyroid problems Mother.  Pregnancy / Birth History Julie [name], Julie [name]; 02/28/2014 3:33 PM) Age at menarche 49 years. Contraceptive History Oral  contraceptives. Gravida 1 Maternal age 2-35 Para 1 Regular periods  Review of Systems (Julie [name]; 02/28/2014 3:33 PM) General Not Present- Appetite Loss, Chills, Fatigue, Fever, Night Sweats, Weight Gain and Weight Loss. Skin Not Present- Change in Wart/Mole, Dryness, Hives, Jaundice, New Lesions, Non-Healing Wounds, Rash and Ulcer. HEENT Present- Nose Bleed, Seasonal Allergies and Wears glasses/contact lenses. Not Present- Earache, Hearing Loss, Hoarseness, Oral Ulcers, Ringing in the Ears, Sinus Pain, Sore Throat, Visual Disturbances and Yellow Eyes. Breast Present- Breast Mass. Not Present- Breast Pain, Nipple Discharge and Skin Changes. Cardiovascular Present- Palpitations. Not Present- Chest Pain, Difficulty Breathing Lying Down, Leg Cramps, Rapid Heart Rate, Shortness of Breath and Swelling of Extremities. Female Genitourinary Present- Frequency. Not Present- Nocturia, Painful Urination, Pelvic Pain and Urgency. Musculoskeletal Present- Joint Pain and Muscle Pain. Not Present- Back Pain, Joint Stiffness, Muscle Weakness and Swelling of Extremities. Neurological Present- Tingling. Not Present- Decreased Memory, Fainting, Headaches, Numbness, Seizures, Tremor, Trouble walking and Weakness. Psychiatric Present- Anxiety, Depression and Fearful. Not Present- Bipolar, Change in Sleep Pattern and Frequent crying.   Vitals (Julie [name] CMA; 02/28/2014 3:35 PM) 02/28/2014 3:34 PM Weight: 123 lb Height: 65in Body Surface Area: 1.6 m Body Mass Index: 20.47 kg/m Temp.: 99.36F(Temporal)  Pulse: 77 (Regular)  BP: 120/80 (Sitting, Left Arm, Standard)    Physical Exam Julie Dibbles S. Julie Starks MD; 02/28/2014 4:51 PM) General Mental Status-Alert. General Appearance-Consistent with stated age. Hydration-Well hydrated. Voice-Normal.  Head and Neck Head-normocephalic, atraumatic with no lesions or palpable masses. Trachea-midline. Thyroid Gland Characteristics - normal  size and consistency.  Eye Eyeball - Bilateral-Extraocular movements intact. Sclera/Conjunctiva - Bilateral-No scleral icterus.  Chest and Lung Exam Chest and lung exam reveals -quiet, even and easy  respiratory effort with no use of accessory muscles and on auscultation, normal breath sounds, no adventitious sounds and normal vocal resonance. Inspection Chest Wall - Normal. Back - normal.  Breast Breast - Left-Symmetric, Non Tender, No Biopsy scars, no Dimpling, No Inflammation, No Lumpectomy scars, No Peau d' Orange. Note: there is a small palpable bruise in the upper in her left breast. Otherwise there is no palpable mass in either breast. There is no palpable axillary, supraclavicular, or cervical lymphadenopathy Breast - Right-Symmetric, Non Tender, No Biopsy scars, no Dimpling, No Inflammation, No Lumpectomy scars, No Mastectomy scars, No Peau d' Orange. Breast Lump-No Palpable Breast Mass.  Cardiovascular Cardiovascular examination reveals -normal heart sounds, regular rate and rhythm with no murmurs and normal pedal pulses bilaterally.  Abdomen Inspection Inspection of the abdomen reveals - No Hernias. Skin - Scar - no surgical scars. Palpation/Percussion Palpation and Percussion of the abdomen reveal - Soft, Non Tender, No Rebound tenderness, No Rigidity (guarding) and No hepatosplenomegaly. Auscultation Auscultation of the abdomen reveals - Bowel sounds normal.  Neurologic Neurologic evaluation reveals -alert and oriented x 3 with no impairment of recent or remote memory. Mental Status-Normal.  Musculoskeletal Normal Exam - Left-Upper Extremity Strength Normal and Lower Extremity Strength Normal. Normal Exam - Right-Upper Extremity Strength Normal and Lower Extremity Strength Normal.  Lymphatic Head & Neck  General Head & Neck Lymphatics: Bilateral - Description - Normal. Axillary  General Axillary Region: Bilateral - Description - Normal.  Tenderness - Non Tender. Femoral & Inguinal  Generalized Femoral & Inguinal Lymphatics: Bilateral - Description - Normal. Tenderness - Non Tender.    Assessment & Plan Julie Dibbles S. Julie Starks MD; 02/28/2014 4:21 PM) BREAST CANCER, FEMALE, LEFT (174.9  C50.912) MALIGNANT NEOPLASM OF UPPER-INNER QUADRANT OF LEFT FEMALE BREAST (174.2  C50.212) Impression: the patient appears to have a small stage I cancer of the upper inner left breast. I have gone over the options in detail for treatment including breast conservation versus mastectomy and at this point she favors breast conservation. I think this is a very reasonable choice for her. She will also require sentinel node mapping. I have discussed with her the risks and benefits of the operation to remove the cancer and checked the lymph nodes as well as some of the technical aspects and she understands and wishes to proceed. She may also be a candidate for genetic testing but she does not want to delay the surgery. Planned for left breast radioactive seed localized lumpectomy and sentinel node mapping  The pt subsequently has decided for left nipple sparing mastectomy and sentinel node mapping and prophylactic right nipple sparing mastectomy   Signed by Luella Cook, MD (02/28/2014 4:51 PM)

## 2014-04-11 NOTE — Op Note (Signed)
Operative Note   DATE OF OPERATION: 11.6.2015  LOCATION: [facility] - observation  SURGICAL DIVISION: Plastic Surgery  PREOPERATIVE DIAGNOSES:  1. Left breast cancer 2. Family history breast cancer  POSTOPERATIVE DIAGNOSES:  same  PROCEDURE:  1. Bilateral breast reconstruction with tissue expanders 2. Acellular dermis for breast reconstruction, total 125 cm 2  SURGEON: Irene Limbo MD MBA  ASSISTANT: none  ANESTHESIA:  General.   EBL: 932 ml  COMPLICATIONS: None immediate.   INDICATIONS FOR PROCEDURE:  The patient, Julie [name], is a 48 y.o. female born on 08/02/1965, is here for bilateral breast reconstruction with expanders following bilateral nipple sparing mastectomies   FINDINGS: [place] 375 ml [place] High Profile expanders placed bilaterally, Initial fill volume 180 ml. Ref [id number] Right SN [id number] Left SN [id number]  DESCRIPTION OF PROCEDURE:  The patient was marked in the preoperative area including breast meridians, sternal notch, chest midline, anterior axillary lines and inframammary folds. The patient was taken to the operating room. SCDs were placed and IV antibiotics were given. The patient's operative site was prepped and draped in a sterile fashion. A time out was performed and all information was confirmed to be correct. Following completion of mastectomies, reconstruction began on right side. The inferior insertions of pectoralis major muscle were divided and submuscular dissection completed. Flex HD was perforated and sewn to inferior border of pectoralis major with running 3-0 vicryl. A 15 Fr drain was placed in subcutaneous position and secured to skin with 2-0 nylon. The cavity was irrigated with solution containing Ancef, genatmicin, and bacitracin. Hemostasis was ensured. The tissue expander was prepared and placed in submuscular position. The expander was secured to chest wall with a 3-0 vicryl. The inferior border of the acellular dermis was inset to  Scarpa's fascia and laterally border was secured to serratus fascia. The incision was closed with 3-0 vicryl in fascial layer and 4-0 vicryl in dermis. Skin closure completed with 4-0 monocryl subcuticular and Dermabond. A similar procedure was performed on the left breast. The ports were accessed and filled to 180 ml bilaterally. The patient was brought to upright position and the skin flaps were redraped so that NAC was symmetric from the sternal notch and midline. Transparent, adherent dressings applied.   The patient was allowed to wake from anesthesia, extubated and taken to the recovery room in satisfactory condition.   SPECIMENS: none  DRAINS: 15 Fr JP in right and left reconstructed breast  Irene Limbo, MD [facility] Plastic & Reconstructive Surgery [phone]

## 2014-04-11 NOTE — Brief Op Note (Signed)
04/11/2014  5:47 PM  PATIENT:  Julie [name]  48 y.o. female  PRE-OPERATIVE DIAGNOSIS:  Left Breast Cancer  POST-OPERATIVE DIAGNOSIS:  Left Breast Cancer  PROCEDURE:  Procedure(s):  Left NIPPLE SPARING MASTECTOMY WITH SENTINAL LYMPH NODE BIOPSY  (Left) Right Proflactic MASTECTOMY (Right) BILATERAL BREAST RECONSTRUCTION WITH PLACEMENT OF TISSUE EXPANDER AND FLEX HD (ACELLULAR HYDRATED DERMIS) (Bilateral)  SURGEON:  Surgeon(s) and Role: Panel 1:    * Autumn Messing III, MD - Primary  Panel 2:    * Irene Limbo, MD - Primary  PHYSICIAN ASSISTANT:   ASSISTANTS: none   ANESTHESIA:   general  EBL:  Total I/O In: 2700 [I.V.:2700] Out: 550 [Urine:315; Blood:235]  BLOOD ADMINISTERED:none  DRAINS: (15) Jackson-Pratt drain(s) with closed bulb suction in the right and left submuscular postion   LOCAL MEDICATIONS USED:  NONE  SPECIMEN:  No Specimen  DISPOSITION OF SPECIMEN:  N/A  COUNTS:  YES  TOURNIQUET:  * No tourniquets in log *  DICTATION: .Note written in EPIC  PLAN OF CARE: Admit for overnight observation  PATIENT DISPOSITION:  PACU - hemodynamically stable.   Delay start of Pharmacological VTE agent (>24hrs) due to surgical blood loss or risk of bleeding: no

## 2014-04-11 NOTE — Anesthesia Preprocedure Evaluation (Addendum)
Anesthesia Evaluation  Patient identified by MRN, date of birth, ID band Patient awake    Reviewed: Allergy & Precautions, H&P , NPO status , Patient's Chart, lab work & pertinent test results  History of Anesthesia Complications (+) PONV  Airway Mallampati: II   Neck ROM: full    Dental   Pulmonary          Cardiovascular negative cardio ROS      Neuro/Psych  Neuromuscular disease    GI/Hepatic negative GI ROS, Neg liver ROS,   Endo/Other  negative endocrine ROS  Renal/GU negative Renal ROS     Musculoskeletal  (+) Arthritis -, Osteoarthritis,  Fibromyalgia -  Abdominal   Peds  Hematology negative hematology ROS (+)   Anesthesia Other Findings   Reproductive/Obstetrics Breast CA                            Anesthesia Physical Anesthesia Plan  ASA: II  Anesthesia Plan: General   Post-op Pain Management:    Induction: Intravenous  Airway Management Planned: Oral ETT  Additional Equipment:   Intra-op Plan:   Post-operative Plan: Extubation in OR  Informed Consent: I have reviewed the patients History and Physical, chart, labs and discussed the procedure including the risks, benefits and alternatives for the proposed anesthesia with the patient or authorized representative who has indicated his/her understanding and acceptance.     Plan Discussed with: Anesthesiologist, CRNA and Surgeon  Anesthesia Plan Comments:         Anesthesia Quick Evaluation

## 2014-04-11 NOTE — Interval H&P Note (Signed)
History and Physical Interval Note:  04/11/2014 12:37 PM  Julie [name]  has presented today for surgery, with the diagnosis of Left Breast Cancer  The various methods of treatment have been discussed with the patient and family. After consideration of risks, benefits and other options for treatment, the patient has consented to  Procedure(s):  Left NIPPLE SPARING MASTECTOMY WITH SENTINAL LYMPH NODE BIOPSY  (Left) Right Proflactic MASTECTOMY (Right) BILATERAL BREAST RECONSTRUCTION WITH PLACEMENT OF TISSUE EXPANDER AND FLEX HD (ACELLULAR HYDRATED DERMIS) (Bilateral) as a surgical intervention .  The patient's history has been reviewed, patient examined, no change in status, stable for surgery.  I have reviewed the patient's chart and labs.  Questions were answered to the patient's satisfaction.     TOTH III,PAUL S

## 2014-04-11 NOTE — Op Note (Signed)
04/11/2014  4:36 PM  PATIENT:  Julie [name]  48 y.o. female  PRE-OPERATIVE DIAGNOSIS:  Left Breast Cancer  POST-OPERATIVE DIAGNOSIS:  Left Breast Cancer  PROCEDURE:  Procedure(s):  Left NIPPLE SPARING MASTECTOMY WITH SENTINAL LYMPH NODE BIOPSY  (Left) Right Prophylactic MASTECTOMY (Right) BILATERAL BREAST RECONSTRUCTION WITH PLACEMENT OF TISSUE EXPANDER AND FLEX HD (ACELLULAR HYDRATED DERMIS) (Bilateral)  SURGEON:  Surgeon(s) and Role: Panel 1:    * Autumn Messing III, MD - Primary  Panel 2:    * Irene Limbo, MD - Primary  PHYSICIAN ASSISTANT:   ASSISTANTS: Dr. Leland Johns   ANESTHESIA:   general  EBL:  Total I/O In: 2000 [I.V.:2000] Out: 67 [Urine:280; Blood:235]  BLOOD ADMINISTERED:none  DRAINS: none   LOCAL MEDICATIONS USED:  NONE  SPECIMEN:  Source of Specimen:  right nipple sparing mastectomy and left nipple sparing mastectomy with sentinel nodes X 4  DISPOSITION OF SPECIMEN:  PATHOLOGY  COUNTS:  YES  TOURNIQUET:  * No tourniquets in log *  DICTATION: .Dragon Dictation  After informed consent was obtained the patient was brought to the operating room and placed in the supine position on the operating room table. After adequate induction of general anesthesia the patient's bilateral chest, breast, and axillary areas were prepped with ChloraPrep, allowed to dry, and draped in usual sterile manner. Earlier today the patient underwent injection of 1 mCi of technetium sulfur colloid in the subareolar position on the left. Attention was first turned to the right breast. An inframammary incision was made with a 10 blade knife. This incision was carried through the skin and subcutaneous tissue sharply with plasma blade.. Skin hooks were then used to elevate the breast skin anteriorly and skin flaps were developed superiorly between the breast tissue and the subcutaneous fat and skin. This dissection was carried all the way to the chest wall superiorly, medially, and  laterally. The tissue behind the nipple was also biopsied with a 15 blade knife and frozen sections of this was negative for tumor. The breast was then removed from the pectoralis muscle with the pectoralis fascia. This dissection was all done with the plasma blade. Once the breast tissue was removed it was marked with a short single stitch at the nipple, a short double stitch superiorly, and a long double stitch laterally. The area was examined and found to be hemostatic. The area was irrigated with saline and packed with a moistened lap sponge. Attention was then turned to the left breast. A similar inframammary incision was made with a 10 blade knife. This incision was carried through the skin and subcutaneous tissue sharply with the plasma blade. Skin hooks were used to elevate the breast skin anteriorly and dissection was carried between the breast tissue and the subcutaneous fat and skin. This dissection was carried all the way to the chest wall superiorly, medially, and laterally. The breast was then removed from the pectoralis muscle with the pectoralis fascia. This dissection was also done sharply with the plasma blade. Once the breast tissue was removed it was oriented with a short single stitch at the nipple, a short double stitch superiorly and a long double stitch laterally. The tissue behind the nipple was biopsied with a 15 blade knife and frozen sections of this were negative. The neoprobe was then used to identify a hot spot in the left axilla. Blunt dissection was carried out in the axilla until the hot lymph node was identified. There were a total of 4 hot lymph nodes identified and  each was dissected by blunt hemostat dissection and sharp dissection with the plasma blade. Several small vessels were controlled with clips. Touch preps on the sentinel nodes were negative. The wound was irrigated with saline and found to be hemostatic. The wound was packed with a moistened lap sponge. At this point  the operation was turned over to [place] for the reconstruction. Her portion of the case will be dictated separately. The patient was in stable condition and had tolerated the surgery well so far. All needle spongecounts were correct.  PLAN OF CARE: Admit for overnight observation  PATIENT DISPOSITION:  PACU - hemodynamically stable.   Delay start of Pharmacological VTE agent (>24hrs) due to surgical blood loss or risk of bleeding: no

## 2014-04-11 NOTE — Interval H&P Note (Signed)
History and Physical Interval Note:  04/11/2014 10:25 AM  Julie [name]  has presented today for surgery, with the diagnosis of Left Breast Cancer  The various methods of treatment have been discussed with the patient and family. After consideration of risks, benefits and other options for treatment, the patient has consented to  Procedure(s):  Left NIPPLE SPARING MASTECTOMY WITH SENTINAL LYMPH NODE BIOPSY  (Left) Right Proflactic MASTECTOMY (Right) BILATERAL BREAST RECONSTRUCTION WITH PLACEMENT OF TISSUE EXPANDER AND FLEX HD (ACELLULAR HYDRATED DERMIS) (Bilateral) as a surgical intervention .  The patient's history has been reviewed, patient examined, no change in status, stable for surgery.  I have reviewed the patient's chart and labs.  Questions were answered to the patient's satisfaction.     [name]

## 2014-04-11 NOTE — Transfer of Care (Signed)
Immediate Anesthesia Transfer of Care Note  Patient: Julie [name]  Procedure(s) Performed: Procedure(s):  Left NIPPLE SPARING MASTECTOMY WITH SENTINAL LYMPH NODE BIOPSY  (Left) Right Proflactic MASTECTOMY (Right) BILATERAL BREAST RECONSTRUCTION WITH PLACEMENT OF TISSUE EXPANDER AND FLEX HD (ACELLULAR HYDRATED DERMIS) (Bilateral)  Patient Location: PACU  Anesthesia Type:General  Level of Consciousness: awake, alert , oriented and patient cooperative  Airway & Oxygen Therapy: Patient Spontanous Breathing and Patient connected to nasal cannula oxygen  Post-op Assessment: Report given to PACU RN and Post -op Vital signs reviewed and stable  Post vital signs: Reviewed and stable  Complications: No apparent anesthesia complications

## 2014-04-11 NOTE — Plan of Care (Signed)
Problem: Phase I Progression Outcomes Goal: Drain functioning/secure Outcome: Completed/Met Date Met:  04/11/14

## 2014-04-12 DIAGNOSIS — C50912 Malignant neoplasm of unspecified site of left female breast: Secondary | ICD-10-CM | POA: Diagnosis not present

## 2014-04-12 MED ORDER — ACETAMINOPHEN 325 MG PO TABS
650.0000 mg | ORAL_TABLET | Freq: Once | ORAL | Status: AC
  Administered 2014-04-12: 650 mg via ORAL
  Filled 2014-04-12: qty 2

## 2014-04-12 MED ORDER — SULFAMETHOXAZOLE-TRIMETHOPRIM 800-160 MG PO TABS: 1.0000 | ORAL_TABLET | Freq: Two times a day (BID) | ORAL | Status: DC

## 2014-04-12 MED ORDER — ACETAMINOPHEN 325 MG PO TABS: 650.0000 mg | ORAL_TABLET | Freq: Four times a day (QID) | ORAL | Status: DC | PRN

## 2014-04-12 MED ORDER — DIAZEPAM 5 MG PO TABS
5.0000 mg | ORAL_TABLET | Freq: Three times a day (TID) | ORAL | Status: DC | PRN
Start: 2014-04-12 — End: 2014-04-29

## 2014-04-12 MED ORDER — WHITE PETROLATUM GEL
Status: AC
  Administered 2014-04-12: 23:00:00
  Filled 2014-04-12: qty 5

## 2014-04-12 NOTE — Progress Notes (Signed)
Pt refused foley to be discontinued at 6am, requests it to be taken out later on  today.

## 2014-04-12 NOTE — Progress Notes (Signed)
POD#1 bilateral NSM, left SLN, bilateral reconstruction with TE/ADM  Temp:  [97.1 F (36.2 C)-101.4 F (38.6 C)] 99.5 F (37.5 C) (11/07 2263) Pulse Rate:  [62-88] 88 (11/07 0608) Resp:  [6-23] 14 (11/07 0608) BP: (93-126)/(50-79) 93/50 mmHg (11/07 0608) SpO2:  [91 %-100 %] 98 % (11/07 0608) Weight:  [54.931 kg (121 lb 1.6 oz)] 54.931 kg (121 lb 1.6 oz) (11/06 1058)   JP 220/195  Declined Foley removal this am, has not been OOB, has not tried oral pain meds Denies nausea  PE: Tegaderms in place without drainage, flaps viable, developing ecchymoses  No evidence hematoma Pt very sleepy- received Valium this am  A/P IS, out of bed, foley out later , needs to try oral meds Bactrim, Valium written for home use Ok to shower starting 04/13/14. Continue binder all times  Irene Limbo, MD [facility] Plastic & Reconstructive Surgery [phone]

## 2014-04-12 NOTE — Progress Notes (Signed)
Utilization Review completed.  

## 2014-04-12 NOTE — Progress Notes (Signed)
1 Day Post-Op  Subjective: Complains of pain of chest wall and very sedated. Hasn't been up yet  Objective: Vital signs in last 24 hours: Temp:  [97.1 F (36.2 C)-101.4 F (38.6 C)] 99.5 F (37.5 C) (11/07 7741) Pulse Rate:  [62-88] 88 (11/07 0608) Resp:  [6-23] 14 (11/07 0608) BP: (93-126)/(50-79) 93/50 mmHg (11/07 0608) SpO2:  [91 %-100 %] 98 % (11/07 0608) Weight:  [121 lb 1.6 oz (54.931 kg)] 121 lb 1.6 oz (54.931 kg) (11/06 1058) Last BM Date: 04/10/14  Intake/Output from previous day: 11/06 0701 - 11/07 0700 In: 3306.3 [I.V.:3256.3; IV Piggyback:50] Out: 1010 [Urine:360; Drains:415; Blood:235] Intake/Output this shift:    Resp: clear to auscultation bilaterally Chest wall: skin flaps look good Cardio: regular rate and rhythm GI: soft, non-tender; bowel sounds normal; no masses,  no organomegaly  Lab Results:  No results for input(s): WBC, HGB, HCT, PLT in the last 72 hours. BMET No results for input(s): NA, K, CL, CO2, GLUCOSE, BUN, CREATININE, CALCIUM in the last 72 hours. PT/INR No results for input(s): LABPROT, INR in the last 72 hours. ABG No results for input(s): PHART, HCO3 in the last 72 hours.  Invalid input(s): PCO2, PO2  Studies/Results: Nm Sentinel Node Inj-no Rpt (breast)  04/11/2014   CLINICAL DATA: left breast cancer   Sulfur colloid was injected intradermally by the nuclear medicine  technologist for breast cancer sentinel node localization.     Anti-infectives: Anti-infectives    Start     Dose/Rate Route Frequency Ordered Stop   04/12/14 0000  sulfamethoxazole-trimethoprim (BACTRIM DS,SEPTRA DS) 800-160 MG per tablet     1 tablet Oral 2 times daily 04/12/14 0717     04/11/14 2200  ceFAZolin (ANCEF) IVPB 1 g/50 mL premix     1 g100 mL/hr over 30 Minutes Intravenous 3 times per day 04/11/14 1905 04/12/14 2159   04/11/14 1300  bacitracin 50,000 Units, gentamicin (GARAMYCIN) 80 mg, ceFAZolin (ANCEF) 1 g in sodium chloride 0.9 % 1,000 mL     Irrigation Once 04/11/14 1249 04/11/14 1346   04/11/14 0600  ceFAZolin (ANCEF) IVPB 2 g/50 mL premix     2 g100 mL/hr over 30 Minutes Intravenous On call to O.R. 04/10/14 1439 04/11/14 1701   04/11/14 0000  sulfamethoxazole-trimethoprim (BACTRIM DS,SEPTRA DS) 800-160 MG per tablet  Status:  Discontinued     1 tablet Oral 2 times daily 04/11/14 1755 04/12/14       Assessment/Plan: s/p Procedure(s):  Left NIPPLE SPARING MASTECTOMY WITH SENTINAL LYMPH NODE BIOPSY  (Left) Right Proflactic MASTECTOMY (Right) BILATERAL BREAST RECONSTRUCTION WITH PLACEMENT OF TISSUE EXPANDER AND FLEX HD (ACELLULAR HYDRATED DERMIS) (Bilateral) Advance diet  Continue to work on pain control OOB and ambulate when less sedate Hopefully will be ready to go tomorrow  LOS: 1 day    TOTH III,Arzell Mcgeehan S 04/12/2014

## 2014-04-12 NOTE — Anesthesia Postprocedure Evaluation (Signed)
  Anesthesia Post-op Note  Patient: Julie [name]  Procedure(s) Performed: Procedure(s):  Left NIPPLE SPARING MASTECTOMY WITH SENTINAL LYMPH NODE BIOPSY  (Left) Right Proflactic MASTECTOMY (Right) BILATERAL BREAST RECONSTRUCTION WITH PLACEMENT OF TISSUE EXPANDER AND FLEX HD (ACELLULAR HYDRATED DERMIS) (Bilateral)  Patient Location: PACU  Anesthesia Type:General  Level of Consciousness: awake and sedated  Airway and Oxygen Therapy: Patient Spontanous Breathing  Post-op Pain: mild  Post-op Assessment: Post-op Vital signs reviewed  Post-op Vital Signs: stable  Last Vitals:  Filed Vitals:   04/12/14 0608  BP: 93/50  Pulse: 88  Temp: 37.5 C  Resp: 14    Complications: No apparent anesthesia complications

## 2014-04-13 DIAGNOSIS — C50912 Malignant neoplasm of unspecified site of left female breast: Secondary | ICD-10-CM | POA: Diagnosis not present

## 2014-04-13 MED ORDER — OXYCODONE-ACETAMINOPHEN 5-325 MG PO TABS: 1.0000 | ORAL_TABLET | ORAL | Status: DC | PRN

## 2014-04-13 MED ORDER — CEFAZOLIN SODIUM 1-5 GM-% IV SOLN
1.0000 g | Freq: Three times a day (TID) | INTRAVENOUS | Status: DC
  Administered 2014-04-13: 1 g via INTRAVENOUS
  Filled 2014-04-13 (×3): qty 50

## 2014-04-13 MED ORDER — SENNOSIDES-DOCUSATE SODIUM 8.6-50 MG PO TABS: 1.0000 | ORAL_TABLET | Freq: Every day | ORAL | Status: DC

## 2014-04-13 MED ORDER — DIAZEPAM 5 MG PO TABS: 5.0000 mg | ORAL_TABLET | Freq: Three times a day (TID) | ORAL | Status: DC | PRN

## 2014-04-13 NOTE — Progress Notes (Signed)
Talked with pt regarding arm precautions, JP drains/record, ABC exercise class (only once approved by MD), support groups/resources.  Breast bag given and 2 pillows for her arms.  Additional breast binder obtained for pt use at home.  "[employer]" pamphlet given.

## 2014-04-13 NOTE — Progress Notes (Signed)
POD#2 bilateral NSM, left SLN, bilateral reconstruction with TE/ADM  Temp:  [97.1 F (36.2 C)-98.7 F (37.1 C)] 98 F (36.7 C) (11/08 0506) Pulse Rate:  [65-73] 68 (11/08 0506) Resp:  [17-18] 18 (11/08 0506) BP: (85-118)/(53-59) 100/59 mmHg (11/08 0506) SpO2:  [98 %-100 %] 98 % (11/08 0506)   JP 210/150 PO 60  Tolerating percocet without nausea, has been OOB to BR only  PE: Tegaderms in place without drainage, flaps viable, ecchymoses left of left NAC soft    A/P Continue IV antibiotics while in hospital, likely home if can tolerate ambulation Reviewed stool softener start tonight either here or at home Bactrim, Valium written for home use Ok to shower.  Irene Limbo, MD [facility] Plastic & Reconstructive Surgery [phone]

## 2014-04-13 NOTE — Progress Notes (Signed)
2 Days Post-Op  Subjective: Still having a lot of pain but slightly better. Still needing IV pain meds. Has not ambulated yet  Objective: Vital signs in last 24 hours: Temp:  [97.1 F (36.2 C)-98.7 F (37.1 C)] 98 F (36.7 C) (11/08 0506) Pulse Rate:  [65-73] 68 (11/08 0506) Resp:  [16-18] 18 (11/08 0506) BP: (85-118)/(44-59) 100/59 mmHg (11/08 0506) SpO2:  [98 %-100 %] 98 % (11/08 0506) Last BM Date: 04/10/14  Intake/Output from previous day: 11/07 0701 - 11/08 0700 In: 1801 [P.O.:580; I.V.:1221] Out: 2110 [Urine:1750; Drains:360] Intake/Output this shift:    Resp: clear to auscultation bilaterally Chest wall: small area of skin ischemia laterally on left Cardio: regular rate and rhythm GI: soft, non-tender; bowel sounds normal; no masses,  no organomegaly  Lab Results:  No results for input(s): WBC, HGB, HCT, PLT in the last 72 hours. BMET No results for input(s): NA, K, CL, CO2, GLUCOSE, BUN, CREATININE, CALCIUM in the last 72 hours. PT/INR No results for input(s): LABPROT, INR in the last 72 hours. ABG No results for input(s): PHART, HCO3 in the last 72 hours.  Invalid input(s): PCO2, PO2  Studies/Results: Nm Sentinel Node Inj-no Rpt (breast)  04/11/2014   CLINICAL DATA: left breast cancer   Sulfur colloid was injected intradermally by the nuclear medicine  technologist for breast cancer sentinel node localization.     Anti-infectives: Anti-infectives    Start     Dose/Rate Route Frequency Ordered Stop   04/12/14 0000  sulfamethoxazole-trimethoprim (BACTRIM DS,SEPTRA DS) 800-160 MG per tablet     1 tablet Oral 2 times daily 04/12/14 0717     04/11/14 2200  ceFAZolin (ANCEF) IVPB 1 g/50 mL premix     1 g100 mL/hr over 30 Minutes Intravenous 3 times per day 04/11/14 1905 04/12/14 1426   04/11/14 1300  bacitracin 50,000 Units, gentamicin (GARAMYCIN) 80 mg, ceFAZolin (ANCEF) 1 g in sodium chloride 0.9 % 1,000 mL      Irrigation Once 04/11/14 1249 04/11/14 1346   04/11/14 0600  ceFAZolin (ANCEF) IVPB 2 g/50 mL premix     2 g100 mL/hr over 30 Minutes Intravenous On call to O.R. 04/10/14 1439 04/11/14 1701   04/11/14 0000  sulfamethoxazole-trimethoprim (BACTRIM DS,SEPTRA DS) 800-160 MG per tablet  Status:  Discontinued     1 tablet Oral 2 times daily 04/11/14 1755 04/12/14       Assessment/Plan: s/p Procedure(s):  Left NIPPLE SPARING MASTECTOMY WITH SENTINAL LYMPH NODE BIOPSY  (Left) Right Proflactic MASTECTOMY (Right) BILATERAL BREAST RECONSTRUCTION WITH PLACEMENT OF TISSUE EXPANDER AND FLEX HD (ACELLULAR HYDRATED DERMIS) (Bilateral) Advance diet  Continue to work on pain control Ambulate today Plan for discharge either later today or tomorrow depending on how she feels  LOS: 2 days    TOTH III,PAUL S 04/13/2014

## 2014-04-15 ENCOUNTER — Telehealth: Payer: Self-pay | Admitting: Genetic Counselor

## 2014-04-15 ENCOUNTER — Encounter (HOSPITAL_COMMUNITY): Payer: Self-pay | Admitting: General Surgery

## 2014-04-15 NOTE — Addendum Note (Signed)
Addendum  created 04/15/14 1202 by Albertha Ghee, MD   Modules edited: Anesthesia Attestations

## 2014-04-15 NOTE — Telephone Encounter (Signed)
Revealed negative genetic test results on breast/ovarian cancer panel

## 2014-04-17 ENCOUNTER — Encounter: Payer: Self-pay | Admitting: Genetic Counselor

## 2014-04-21 NOTE — Discharge Summary (Signed)
Physician Discharge Summary  Patient ID: TERAN DAUGHENBAUGH MRN: [id number] DOB/AGE: 08-04-1965 48 y.o.  Admit date: 04/11/2014 Discharge date: 04/21/2014  Admission Diagnoses:  Discharge Diagnoses:  Active Problems:   Breast cancer, female   Discharged Condition: good  Hospital Course: the pt underwent bilateral nipple sparing mastectomies for breast cancer. She tolerated surgery well but postoperative had issues with pain and nausea which required a couple days in the hospital before it resolved enough that she could be discharged home  Consults: None  Significant Diagnostic Studies: none  Treatments: surgery: as above  Discharge Exam: Blood pressure 97/65, pulse 63, temperature 98.1 F (36.7 C), temperature source Oral, resp. rate 18, height 5' 4.5" (1.638 m), weight 121 lb 1.6 oz (54.931 kg), SpO2 99 %. Resp: clear to auscultation bilaterally Chest wall: skin flaps ok with small area of discoloration laterally on left Cardio: regular rate and rhythm GI: soft, non-tender; bowel sounds normal; no masses,  no organomegaly  Disposition: 01-Home or Self Care  Discharge Instructions    Call MD for:  redness, tenderness, or signs of infection (pain, swelling, bleeding, redness, odor or green/yellow discharge around incision site)    Complete by:  As directed      Call MD for:  severe or increased pain, loss or decreased feeling  in affected limb(s)    Complete by:  As directed      Call MD for:  temperature >100.5    Complete by:  As directed      Discharge instructions    Complete by:  As directed   [place] to shower starting 04/13/14. Pat tegaderms dry.  Breast binder all times. Strip and record JP drains twice daily and bring log to clinic visit.   Ok to lift arms above shoulder level.     Driving Restrictions    Complete by:  As directed   No driving while taking narcotics     Lifting restrictions    Complete by:  As directed   No lifting greater than 5 lbs     Resume  previous diet    Complete by:  As directed             Medication List    TAKE these medications        diazepam 5 MG tablet  Commonly known as:  VALIUM  Take 1 tablet (5 mg total) by mouth every 8 (eight) hours as needed for anxiety or muscle spasms.     diazepam 5 MG tablet  Commonly known as:  VALIUM  Take 1 tablet (5 mg total) by mouth every 8 (eight) hours as needed for anxiety or muscle spasms.     FLUoxetine 20 MG capsule  Commonly known as:  PROZAC  Take 20 mg by mouth daily.     loratadine 10 MG tablet  Commonly known as:  CLARITIN  Take 10 mg by mouth daily as needed for allergies.     oxyCODONE-acetaminophen 5-325 MG per tablet  Commonly known as:  PERCOCET/ROXICET  Take 1-2 tablets by mouth every 4 (four) hours as needed for moderate pain.     sulfamethoxazole-trimethoprim 800-160 MG per tablet  Commonly known as:  BACTRIM DS,SEPTRA DS  Take 1 tablet by mouth 2 (two) times daily.     tamoxifen 10 MG tablet  Commonly known as:  NOLVADEX  Take 1 tablet (10 mg total) by mouth 2 (two) times daily.           Follow-up Information  Follow up with Irene Limbo, MD On 04/23/2014.   Specialty:  Plastic Surgery   Why:  1 30 pm   Contact information:   [address] [place] [address] [phone]       Follow up with Merrie Roof, MD In 2 weeks.   Specialty:  General Surgery   Contact information:   [place] [address] [phone]       Signed: Merrie Roof 04/21/2014, 3:00 PM

## 2014-04-29 ENCOUNTER — Ambulatory Visit: Payer: BC Managed Care – PPO

## 2014-04-29 ENCOUNTER — Encounter: Payer: Self-pay | Admitting: Hematology and Oncology

## 2014-04-29 ENCOUNTER — Ambulatory Visit (HOSPITAL_BASED_OUTPATIENT_CLINIC_OR_DEPARTMENT_OTHER): Payer: BC Managed Care – PPO | Admitting: Hematology and Oncology

## 2014-04-29 ENCOUNTER — Encounter: Payer: Self-pay | Admitting: *Deleted

## 2014-04-29 ENCOUNTER — Telehealth: Payer: Self-pay | Admitting: Hematology and Oncology

## 2014-04-29 VITALS — BP 117/69 | HR 79 | Temp 98.0°F | Resp 18 | Ht 64.5 in | Wt 117.2 lb

## 2014-04-29 DIAGNOSIS — C50212 Malignant neoplasm of upper-inner quadrant of left female breast: Secondary | ICD-10-CM

## 2014-04-29 DIAGNOSIS — Z170 Estrogen receptor positive status [ER+]: Secondary | ICD-10-CM

## 2014-04-29 MED ORDER — TAMOXIFEN CITRATE 20 MG PO TABS: 20.0000 mg | ORAL_TABLET | Freq: Every day | ORAL | Status: DC

## 2014-04-29 NOTE — Progress Notes (Signed)
CONSULT NOTE  Patient Care Team: Maisie Fus, MD as PCP - General (Obstetrics and Gynecology)  CHIEF COMPLAINTS/PURPOSE OF CONSULTATION:  Multifocal left breast cancer  HISTORY OF PRESENTING ILLNESS:  Julie [name] 48 y.o. female is here because of recent diagnosis of left breast cancer. She had an abnormal 3-D mammogram that revealed abnormalities to diagnostic mammogram ultrasound and biopsy. On the MRI a second nodule is notedPatient was found to have left breast mass which was biopsied and 02/23/2014 that revealed invasive ductal carcinoma that was ER/PR positive HER-2 negative. She underwent breast MRI that revealed left breast multifocal disease the 10 mm and 7 mm in size. She subsequently underwent bilateral mastectomies on 04/11/2014 that revealed a 0.8 cm and 0.9 cm left breasts lesions for sentinel lymph nodes were negative. He had bilateral nipple biopsy Cipro negative. Right mastectomy did not reveal any cancer and 2 lymph nodes were negative. Patient is recovering fairly well from the surgery. She is here today to discuss adjuvant treatment options. Patient works part-time at [employer] and has a 20 year old son. She is recently married. I reviewed her records extensively and collaborated the history with the patient.  SUMMARY OF ONCOLOGIC HISTORY:   Breast cancer of upper-inner quadrant of left female breast   02/25/2014 Initial Biopsy Left breast mass 11:00: Invasive ductal carcinoma grade 1, ER 100%, PR 99%, Ki-67 14%, HER-2 negative ratio 1.5   03/05/2014 Breast MRI Left breast 11:00, 10 x 7 x 7 mm, in addition 7 x 5 x 6 mm masslike area, negative lymph nodes   04/11/2014 Surgery Bilateral mastectomies: Left breast: Multifocal IDC 0.8 cm and 0.9 cm with DCIS 4 SLN negative, bilateral nipple biopsy is negative: Right breast mastectomy no malignancy 2 lymph nodes negative: ER/PR positive HER-2 negative Ki-67 14-16%    Procedure Genetic testing did not reveal any mutations     MEDICAL HISTORY:  Past Medical History  Diagnosis Date  . Breast cancer 02/2014    ER+/PR+/Her2-  . PONV (postoperative nausea and vomiting)     extremely sick  . Arthritis   . Fibromyalgia     ?    SURGICAL HISTORY: Past Surgical History  Procedure Laterality Date  . Tubal ligation      09  . Tonsillectomy      99  . Total mastectomy Right 04/11/2014    Procedure: Right Proflactic MASTECTOMY;  Surgeon: Autumn Messing III, MD;  Location: [facility];  Service: General;  Laterality: Right;  . Breast reconstruction with placement of tissue expander and flex hd (acellular hydrated dermis) Bilateral 04/11/2014    Procedure: BILATERAL BREAST RECONSTRUCTION WITH PLACEMENT OF TISSUE EXPANDER AND FLEX HD (ACELLULAR HYDRATED DERMIS);  Surgeon: Irene Limbo, MD;  Location: [place];  Service: Plastics;  Laterality: Bilateral;    SOCIAL HISTORY: History   Social History  . Marital Status: Married    Spouse Name: N/A    Number of Children: 1  . Years of Education: N/A   Occupational History  . Not on file.   Social History Main Topics  . Smoking status: Never Smoker   . Smokeless tobacco: Not on file  . Alcohol Use: Yes     Comment: occ  . Drug Use: No  . Sexual Activity: Yes   Other Topics Concern  . Not on file   Social History Narrative    FAMILY HISTORY: Family History  Problem Relation Age of Onset  . Breast cancer Paternal Aunt     dx in  her 65s  . Stroke Maternal Grandmother   . Bone cancer Maternal Grandfather   . Prostate cancer Maternal Grandfather   . Breast cancer Paternal Grandmother 35  . Stroke Paternal Grandfather   . Breast cancer Paternal Aunt     dx in her 74s    ALLERGIES:  is allergic to codeine and hydrocodone.  MEDICATIONS:  Current Outpatient Prescriptions  Medication Sig Dispense Refill  . diazepam (VALIUM) 5 MG tablet Take 1 tablet (5 mg total) by mouth every 8 (eight) hours as needed for anxiety or muscle spasms. 30 tablet 0  . FLUoxetine  (PROZAC) 20 MG capsule Take 20 mg by mouth daily.    Marland Kitchen loratadine (CLARITIN) 10 MG tablet Take 10 mg by mouth daily as needed for allergies.    Marland Kitchen sulfamethoxazole-trimethoprim (BACTRIM DS,SEPTRA DS) 800-160 MG per tablet Take 1 tablet by mouth 2 (two) times daily. 12 tablet 0  . tamoxifen (NOLVADEX) 10 MG tablet 20 mg.    . oxyCODONE-acetaminophen (PERCOCET/ROXICET) 5-325 MG per tablet Take 1-2 tablets by mouth every 4 (four) hours as needed for moderate pain. (Patient not taking: Reported on 04/29/2014) 50 tablet 0   No current facility-administered medications for this visit.    REVIEW OF SYSTEMS:   Constitutional: Denies fevers, chills or abnormal night sweats Eyes: Denies blurriness of vision, double vision or watery eyes Ears, nose, mouth, throat, and face: Denies mucositis or sore throat Respiratory: Denies cough, dyspnea or wheezes Cardiovascular: Denies palpitation, chest discomfort or lower extremity swelling Gastrointestinal:  Denies nausea, heartburn or change in bowel habits Skin: Denies abnormal skin rashes Lymphatics: Denies new lymphadenopathy or easy bruising Neurological:Denies numbness, tingling or new weaknesses Behavioral/Psych: Mood is stable, no new changes  Breast: soreness from recent surgery All other systems were reviewed with the patient and are negative.  PHYSICAL EXAMINATION: ECOG PERFORMANCE STATUS: 1 - Symptomatic but completely ambulatory  Filed Vitals:   04/29/14 1312  BP: 117/69  Pulse: 79  Temp: 98 F (36.7 C)  Resp: 18   Filed Weights   04/29/14 1312  Weight: 117 lb 3.2 oz (53.162 kg)    GENERAL:alert, no distress and comfortable SKIN: skin color, texture, turgor are normal, no rashes or significant lesions EYES: normal, conjunctiva are pink and non-injected, sclera clear OROPHARYNX:no exudate, no erythema and lips, buccal mucosa, and tongue normal  NECK: supple, thyroid normal size, non-tender, without nodularity LYMPH:  no palpable  lymphadenopathy in the cervical, axillary or inguinal LUNGS: clear to auscultation and percussion with normal breathing effort HEART: regular rate & rhythm and no murmurs and no lower extremity edema ABDOMEN:abdomen soft, non-tender and normal bowel sounds Musculoskeletal:no cyanosis of digits and no clubbing  PSYCH: alert & oriented x 3 with fluent speech NEURO: no focal motor/sensory deficits  LABORATORY DATA:  I have reviewed the data as listed Lab Results  Component Value Date   WBC 5.3 04/08/2014   HGB 12.3 04/08/2014   HCT 37.8 04/08/2014   MCV 83.3 04/08/2014   PLT 172 04/08/2014   Lab Results  Component Value Date   NA 140 04/08/2014   K 3.9 04/08/2014   CL 104 04/08/2014   CO2 25 04/08/2014    RADIOGRAPHIC STUDIES: I have personally reviewed the radiological reports and agreed with the findings in the report.  ASSESSMENT AND PLAN:  Breast cancer of upper-inner quadrant of left female breast Left breast multifocal invasive ductal carcinoma 0.8 cm and 0.9 cm, ER/PR positive HER-2 negative (ratio is 0.97 and 1.23) Ki-67  ranged 14-16% status post bilateral mastectomies 4 SLN negative.  Radiology and pathology counseling: I discussed radiology and pathology report in great detail explaining the difference between DCIS and invasive cancer. Discussed significance of ER PR and HER-2/neu testing and the implications and treatment.  Recommendation: Oncotype DX testing to evaluate risk of recurrence as well as predict of chemotherapy benefit. I explained to the patient that this is a 21 gene panel to evaluate patient tumors DNA to calculate recurrence score. This would help determine whether patient has high risk or intermediate risk or low risk breast cancer. She understands that if her tumor was found to be high risk, she would benefit from systemic chemotherapy. If low risk, no need of chemotherapy. If she was found to be intermediate risk, we would need to evaluate the score as  well as other risk factors and determine if an abbreviated chemotherapy may be of benefit.  Return to clinic in 2 weeks to discuss Oncotype DX testing and to proceed with further treatment based on the results.   tamoxifen counseling: Patient was taking 40 mg of tamoxifen daily. I instructed her to decrease it to 20 mg once a day. In the 40 mg dose that she was getting a lot of side effects including mood changes, difficulty sleeping, anxiety spells. Surprisingly she did not have any hot flashes or myalgias.    Rulon Eisenmenger, MD 04/29/2014 1:26 PM

## 2014-04-29 NOTE — Assessment & Plan Note (Signed)
Left breast multifocal invasive ductal carcinoma 0.8 cm and 0.9 cm, ER/PR positive HER-2 negative (ratio is 0.97 and 1.23) Ki-67 ranged 14-16% status post bilateral mastectomies 4 SLN negative.  Radiology and pathology counseling: I discussed radiology and pathology report in great detail explaining the difference between DCIS and invasive cancer. Discussed significance of ER PR and HER-2/neu testing and the implications and treatment.  Recommendation: Oncotype DX testing to evaluate risk of recurrence as well as predict of chemotherapy benefit. I explained to the patient that this is a 21 gene panel to evaluate patient tumors DNA to calculate recurrence score. This would help determine whether patient has high risk or intermediate risk or low risk breast cancer. She understands that if her tumor was found to be high risk, she would benefit from systemic chemotherapy. If low risk, no need of chemotherapy. If she was found to be intermediate risk, we would need to evaluate the score as well as other risk factors and determine if an abbreviated chemotherapy may be of benefit.  Return to clinic in 2 weeks to discuss Oncotype DX testing and to proceed with further treatment based on the results.

## 2014-04-29 NOTE — Progress Notes (Signed)
Checked in new pt with no financial concerns at this time.  I informed pt that if chemo is part of her treatment I will call her ins to see if Josem Kaufmann is req and will obtain it if it is and also contact foundations that offer copay assistance if needed.  She has my card for any questions or concerns.

## 2014-04-29 NOTE — Progress Notes (Signed)
Received order for oncotype DX testing. Requisition sent to pathology. Received by Alyse Low. PAC sent to [facility].

## 2014-04-29 NOTE — Telephone Encounter (Signed)
oer oif to sch pt appt-gave pt c=opy of sch

## 2014-05-05 NOTE — H&P (Signed)
   Expand All Collapse All  Subjective:    Patient ID: Julie [name] is a 48 y.o. female.  HPI 4 weeks post op from bilateral NSM with TE/ADM reconstruction. Drains R 55/50 L 45/25. Notes has to keep open to air for left not to drain and remain scabbed over.  Seen by Dr. Lindi Adie and rec decreasing tamoxifen dose. Oncotype pending.  Final path with benign right breast, left breast with two foci IDC 0.8 and 0.9 cm. 0/4 SLN. Genetic testing negative. Patient presented following MMG with left breast 7 mm mass at 11 o clock, posterior third. [place] concordant and biospy demonstrated IDC. Underwent MRI and R breast benign, left with additional oval mass at 11 o clock middle third. Bx of second area IDC ER/PR+ Her 2 -.   Prior 36 C, deisres Full C Mastectomy weight left 382 g Right 373 g  Review of Systems     Objective:    Physical Exam  Cardiovascular: Normal rate and regular rhythm.  Pulmonary/Chest: Effort normal and breath sounds normal.      Right breast soft, drains serous, Epidermolysis R nipple stable  Left NAC scab removed, viable, loss of some NAC at 5 o clock position. Skin lateral and inferior to this with dry eschar- removed some scabs and full thickness injury noted. Surrounding redness consistent with ischemia Assessment:      Left breast cancer  S/p bilat NSM with TE/ADM reconstruction    Plan:      Full thickness ischemia/skin loss over left breast. Will plan debridement and possible expander exchange. She will continue Bactrim in interim. Continue drains. Counseled may need to deflate expander on left further, will hold off on right expansion until left stable. Counseled she will have additional vertical scar on left breast.  [place] 375 ml Artoura High Profile expanders placed bilaterally,  R Initial fill volume 250 ml  L Initial fill volume 180 ml   Irene Limbo, MD [facility] Plastic & Reconstructive Surgery [phone]

## 2014-05-06 ENCOUNTER — Encounter (HOSPITAL_COMMUNITY): Payer: Self-pay | Admitting: *Deleted

## 2014-05-06 MED ORDER — CEFAZOLIN SODIUM-DEXTROSE 2-3 GM-% IV SOLR
2.0000 g | INTRAVENOUS | Status: AC
  Administered 2014-05-07: 2 g via INTRAVENOUS
  Filled 2014-05-06: qty 50

## 2014-05-07 ENCOUNTER — Encounter (HOSPITAL_COMMUNITY): Payer: Self-pay | Admitting: *Deleted

## 2014-05-07 ENCOUNTER — Ambulatory Visit (HOSPITAL_COMMUNITY)
Admission: RE | Admit: 2014-05-07 | Discharge: 2014-05-07 | Disposition: A | Discharge status: Home / Self Care | Payer: BC Managed Care – PPO | Source: Ambulatory Visit | Attending: Plastic Surgery | Admitting: Plastic Surgery

## 2014-05-07 ENCOUNTER — Encounter (HOSPITAL_COMMUNITY)
Admission: RE | Disposition: A | Discharge status: Home / Self Care | Payer: Self-pay | Source: Ambulatory Visit | Attending: Plastic Surgery

## 2014-05-07 ENCOUNTER — Ambulatory Visit (HOSPITAL_COMMUNITY): Payer: BC Managed Care – PPO | Admitting: Anesthesiology

## 2014-05-07 DIAGNOSIS — Z9012 Acquired absence of left breast and nipple: Secondary | ICD-10-CM | POA: Insufficient documentation

## 2014-05-07 DIAGNOSIS — C50912 Malignant neoplasm of unspecified site of left female breast: Secondary | ICD-10-CM | POA: Diagnosis present

## 2014-05-07 DIAGNOSIS — M1990 Unspecified osteoarthritis, unspecified site: Secondary | ICD-10-CM | POA: Diagnosis not present

## 2014-05-07 DIAGNOSIS — M797 Fibromyalgia: Secondary | ICD-10-CM | POA: Insufficient documentation

## 2014-05-07 HISTORY — DX: Anemia, unspecified: D64.9

## 2014-05-07 HISTORY — PX: BREAST RECONSTRUCTION WITH PLACEMENT OF TISSUE EXPANDER AND FLEX HD (ACELLULAR HYDRATED DERMIS): SHX6295

## 2014-05-07 LAB — BASIC METABOLIC PANEL
Anion gap: 11 (ref 5–15)
BUN: 8 mg/dL (ref 6–23)
CHLORIDE: 106 meq/L (ref 96–112)
CO2: 23 mEq/L (ref 19–32)
Calcium: 8.5 mg/dL (ref 8.4–10.5)
Creatinine, Ser: 0.85 mg/dL (ref 0.50–1.10)
GFR calc non Af Amer: 81 mL/min — ABNORMAL LOW (ref >90)
GLUCOSE: 83 mg/dL (ref 70–99)
POTASSIUM: 4.2 meq/L (ref 3.7–5.3)
Sodium: 140 mEq/L (ref 137–147)

## 2014-05-07 LAB — CBC
HCT: 34.5 % — ABNORMAL LOW (ref 36.0–46.0)
HEMOGLOBIN: 11 g/dL — AB (ref 12.0–15.0)
MCH: 26.4 pg (ref 26.0–34.0)
MCHC: 31.9 g/dL (ref 30.0–36.0)
MCV: 82.9 fL (ref 78.0–100.0)
Platelets: 218 10*3/uL (ref 150–400)
RBC: 4.16 MIL/uL (ref 3.87–5.11)
RDW: 15.3 % (ref 11.5–15.5)
WBC: 4.2 10*3/uL (ref 4.0–10.5)

## 2014-05-07 LAB — HCG, SERUM, QUALITATIVE: PREG SERUM: NEGATIVE

## 2014-05-07 SURGERY — BREAST RECONSTRUCTION WITH PLACEMENT OF TISSUE EXPANDER AND FLEX HD (ACELLULAR HYDRATED DERMIS)
Anesthesia: General | Site: Breast | Laterality: Left

## 2014-05-07 MED ORDER — LACTATED RINGERS IV SOLN
INTRAVENOUS | Status: DC
  Administered 2014-05-07: 13:00:00 via INTRAVENOUS

## 2014-05-07 MED ORDER — SODIUM CHLORIDE 0.9 % IV SOLN
INTRAVENOUS | Status: DC
  Filled 2014-05-07: qty 1

## 2014-05-07 MED ORDER — SCOPOLAMINE 1 MG/3DAYS TD PT72
MEDICATED_PATCH | TRANSDERMAL | Status: AC
  Administered 2014-05-07: 1 via TRANSDERMAL
  Filled 2014-05-07: qty 1

## 2014-05-07 MED ORDER — FENTANYL CITRATE 0.05 MG/ML IJ SOLN
INTRAMUSCULAR | Status: DC | PRN
  Administered 2014-05-07: 100 ug via INTRAVENOUS
  Administered 2014-05-07: 50 ug via INTRAVENOUS

## 2014-05-07 MED ORDER — CHLORHEXIDINE GLUCONATE 4 % EX LIQD
1.0000 "application " | Freq: Once | CUTANEOUS | Status: DC
  Filled 2014-05-07: qty 15

## 2014-05-07 MED ORDER — SODIUM CHLORIDE 0.9 % IV SOLN
INTRAVENOUS | Status: DC | PRN
  Administered 2014-05-07: 15:00:00

## 2014-05-07 MED ORDER — PROPOFOL 10 MG/ML IV BOLUS
INTRAVENOUS | Status: DC | PRN
  Administered 2014-05-07: 150 mg via INTRAVENOUS

## 2014-05-07 MED ORDER — SODIUM CHLORIDE 0.9 % IR SOLN
Status: DC | PRN
  Administered 2014-05-07: 1000 mL

## 2014-05-07 MED ORDER — NEOSTIGMINE METHYLSULFATE 10 MG/10ML IV SOLN
INTRAVENOUS | Status: DC | PRN
  Administered 2014-05-07: 3 mg via INTRAVENOUS

## 2014-05-07 MED ORDER — DEXAMETHASONE SODIUM PHOSPHATE 4 MG/ML IJ SOLN
INTRAMUSCULAR | Status: DC | PRN
  Administered 2014-05-07: 8 mg via INTRAVENOUS

## 2014-05-07 MED ORDER — ONDANSETRON HCL 4 MG/2ML IJ SOLN
INTRAMUSCULAR | Status: DC | PRN
Start: 2014-05-07 — End: 2014-05-07
  Administered 2014-05-07: 4 mg via INTRAVENOUS

## 2014-05-07 MED ORDER — LACTATED RINGERS IV SOLN
INTRAVENOUS | Status: DC | PRN
  Administered 2014-05-07 (×2): via INTRAVENOUS

## 2014-05-07 MED ORDER — HYDROMORPHONE HCL 1 MG/ML IJ SOLN
0.2500 mg | INTRAMUSCULAR | Status: DC | PRN
  Administered 2014-05-07 (×2): 0.5 mg via INTRAVENOUS

## 2014-05-07 MED ORDER — PROPOFOL 10 MG/ML IV BOLUS
INTRAVENOUS | Status: AC
  Filled 2014-05-07: qty 20

## 2014-05-07 MED ORDER — GLYCOPYRROLATE 0.2 MG/ML IJ SOLN
INTRAMUSCULAR | Status: DC | PRN
  Administered 2014-05-07: .6 mg via INTRAVENOUS

## 2014-05-07 MED ORDER — SULFAMETHOXAZOLE-TRIMETHOPRIM 800-160 MG PO TABS: 1.0000 | ORAL_TABLET | Freq: Two times a day (BID) | ORAL | Status: DC

## 2014-05-07 MED ORDER — MIDAZOLAM HCL 2 MG/2ML IJ SOLN
INTRAMUSCULAR | Status: AC
  Filled 2014-05-07: qty 2

## 2014-05-07 MED ORDER — PROMETHAZINE HCL 12.5 MG PO TABS: 12.5000 mg | ORAL_TABLET | Freq: Four times a day (QID) | ORAL | Status: DC | PRN

## 2014-05-07 MED ORDER — ONDANSETRON HCL 4 MG/2ML IJ SOLN: 4.0000 mg | Freq: Once | INTRAMUSCULAR | Status: DC | PRN

## 2014-05-07 MED ORDER — 0.9 % SODIUM CHLORIDE (POUR BTL) OPTIME
TOPICAL | Status: DC | PRN
  Administered 2014-05-07: 2000 mL

## 2014-05-07 MED ORDER — FENTANYL CITRATE 0.05 MG/ML IJ SOLN
INTRAMUSCULAR | Status: AC
  Filled 2014-05-07: qty 5

## 2014-05-07 MED ORDER — HYDROMORPHONE HCL 1 MG/ML IJ SOLN
INTRAMUSCULAR | Status: AC
  Administered 2014-05-07: 0.5 mg via INTRAVENOUS
  Filled 2014-05-07: qty 1

## 2014-05-07 MED ORDER — ROCURONIUM BROMIDE 100 MG/10ML IV SOLN
INTRAVENOUS | Status: DC | PRN
  Administered 2014-05-07: 40 mg via INTRAVENOUS

## 2014-05-07 MED ORDER — MIDAZOLAM HCL 5 MG/5ML IJ SOLN
INTRAMUSCULAR | Status: DC | PRN
  Administered 2014-05-07: 2 mg via INTRAVENOUS

## 2014-05-07 SURGICAL SUPPLY — 64 items
ALLOGRAFT TISSUE 4X7 (Tissue Mesh) ×1 IMPLANT
BAG DECANTER FOR FLEXI CONT (MISCELLANEOUS) ×2 IMPLANT
BINDER BREAST LRG (GAUZE/BANDAGES/DRESSINGS) ×1 IMPLANT
BINDER BREAST XLRG (GAUZE/BANDAGES/DRESSINGS) IMPLANT
BLADE SURG 10 STRL SS (BLADE) ×2 IMPLANT
CANISTER SUCTION 2500CC (MISCELLANEOUS) ×2 IMPLANT
CHLORAPREP W/TINT 26ML (MISCELLANEOUS) ×2 IMPLANT
COVER SURGICAL LIGHT HANDLE (MISCELLANEOUS) ×2 IMPLANT
DRAIN CHANNEL 19F RND (DRAIN) ×1 IMPLANT
DRAPE ORTHO SPLIT 77X108 STRL (DRAPES) ×4
DRAPE PROXIMA HALF (DRAPES) ×4 IMPLANT
DRAPE SURG ORHT 6 SPLT 77X108 (DRAPES) ×2 IMPLANT
DRAPE WARM FLUID 44X44 (DRAPE) ×2 IMPLANT
DRSG PAD ABDOMINAL 8X10 ST (GAUZE/BANDAGES/DRESSINGS) ×4 IMPLANT
DRSG TEGADERM 4X4.75 (GAUZE/BANDAGES/DRESSINGS) ×4 IMPLANT
ELECT BLADE 4.0 EZ CLEAN MEGAD (MISCELLANEOUS)
ELECT COATED BLADE 2.86 ST (ELECTRODE) ×1 IMPLANT
ELECT REM PT RETURN 9FT ADLT (ELECTROSURGICAL) ×2
ELECTRODE BLDE 4.0 EZ CLN MEGD (MISCELLANEOUS) ×1 IMPLANT
ELECTRODE REM PT RTRN 9FT ADLT (ELECTROSURGICAL) ×1 IMPLANT
EVACUATOR SILICONE 100CC (DRAIN) ×2 IMPLANT
EXPANDER BREAST 300CC (Expander) ×1 IMPLANT
GAUZE SPONGE 4X4 12PLY STRL (GAUZE/BANDAGES/DRESSINGS) ×1 IMPLANT
GLOVE BIO SURGEON STRL SZ 6 (GLOVE) ×5 IMPLANT
GLOVE BIOGEL PI IND STRL 6.5 (GLOVE) IMPLANT
GLOVE BIOGEL PI IND STRL 7.0 (GLOVE) IMPLANT
GLOVE BIOGEL PI INDICATOR 6.5 (GLOVE) ×1
GLOVE BIOGEL PI INDICATOR 7.0 (GLOVE) ×1
GLOVE SURG SS PI 6.0 STRL IVOR (GLOVE) ×1 IMPLANT
GLOVE SURG SS PI 6.5 STRL IVOR (GLOVE) ×2 IMPLANT
GLOVE SURG SS PI 7.0 STRL IVOR (GLOVE) ×1 IMPLANT
GOWN STRL REUS W/ TWL LRG LVL3 (GOWN DISPOSABLE) ×2 IMPLANT
GOWN STRL REUS W/TWL LRG LVL3 (GOWN DISPOSABLE) ×8
KIT BASIN OR (CUSTOM PROCEDURE TRAY) ×2 IMPLANT
KIT ROOM TURNOVER OR (KITS) ×2 IMPLANT
LIQUID BAND (GAUZE/BANDAGES/DRESSINGS) ×1 IMPLANT
MARKER SKIN DUAL TIP RULER LAB (MISCELLANEOUS) ×1 IMPLANT
NDL INFUSION SET 21GA (NEEDLE) IMPLANT
NEEDLE INFUSION SET 21GA (NEEDLE) ×2 IMPLANT
NS IRRIG 1000ML POUR BTL (IV SOLUTION) ×4 IMPLANT
PACK GENERAL/GYN (CUSTOM PROCEDURE TRAY) ×2 IMPLANT
PAD ARMBOARD 7.5X6 YLW CONV (MISCELLANEOUS) ×2 IMPLANT
PIN SAFETY STERILE (MISCELLANEOUS) ×2 IMPLANT
SCRUB POVIDONE IODINE 4 OZ (MISCELLANEOUS) ×1 IMPLANT
SET ASEPTIC TRANSFER (MISCELLANEOUS) ×2 IMPLANT
SOL PREP POV-IOD 4OZ 10% (MISCELLANEOUS) ×1 IMPLANT
SOLUTION BETADINE 4OZ (MISCELLANEOUS) ×2 IMPLANT
STAPLER VISISTAT 35W (STAPLE) IMPLANT
STRIP CLOSURE SKIN 1/2X4 (GAUZE/BANDAGES/DRESSINGS) ×1 IMPLANT
SUT ETHILON 2 0 FS 18 (SUTURE) ×3 IMPLANT
SUT ETHILON 4 0 PS 2 18 (SUTURE) ×2 IMPLANT
SUT MNCRL AB 4-0 PS2 18 (SUTURE) ×2 IMPLANT
SUT MON AB 5-0 PS2 18 (SUTURE) ×2 IMPLANT
SUT PDS AB 2-0 CT1 27 (SUTURE) IMPLANT
SUT SILK 3 0 SH 30 (SUTURE) IMPLANT
SUT VIC AB 3-0 PS2 18 (SUTURE)
SUT VIC AB 3-0 PS2 18XBRD (SUTURE) IMPLANT
SUT VIC AB 3-0 SH 27 (SUTURE) ×4
SUT VIC AB 3-0 SH 27X BRD (SUTURE) ×4 IMPLANT
SUT VICRYL 4-0 PS2 18IN ABS (SUTURE) ×4 IMPLANT
SYR BULB IRRIGATION 50ML (SYRINGE) ×1 IMPLANT
TAPE CLOTH SURG 4X10 WHT LF (GAUZE/BANDAGES/DRESSINGS) ×1 IMPLANT
TOWEL OR 17X24 6PK STRL BLUE (TOWEL DISPOSABLE) ×2 IMPLANT
TOWEL OR 17X26 10 PK STRL BLUE (TOWEL DISPOSABLE) ×2 IMPLANT

## 2014-05-07 NOTE — Interval H&P Note (Signed)
History and Physical Interval Note:  05/07/2014 7:41 AM  Julie [name]  has presented today for surgery, with the diagnosis of ACQUIRED ABSENCE BILATERAL BREAST/HISTORY OF BREAST CANCER  The various methods of treatment have been discussed with the patient and family. After consideration of risks, benefits and other options for treatment, the patient has consented to  Procedure(s): DEBRIDEMENT LEFT BREAST RECONSTRUCTION WITH POSSIBLE PLACEMENT OF TISSUE EXPANDER (Left) as a surgical intervention .  The patient's history has been reviewed, patient examined, no change in status, stable for surgery.  I have reviewed the patient's chart and labs.  Questions were answered to the patient's satisfaction.     [name]

## 2014-05-07 NOTE — Op Note (Signed)
Operative Note   DATE OF OPERATION: 12.2.2015  LOCATION: [facility]- outpatient  SURGICAL DIVISION: Plastic Surgery  PREOPERATIVE DIAGNOSES:  1. Left breast cancer 2. Acquired absence left breast 3. Necrotic mastectomy flap left  POSTOPERATIVE DIAGNOSES:  same  PROCEDURE:  1. Debridement of left reconstructed breast skin and subcutaneous tissue. 2. Exchange of tissue expander in breast reconstruction 3. Acelluar dermis for breast reconstruction total 25 cm2  SURGEON: Irene Limbo MD MBA  ASSISTANT: none  ANESTHESIA:  General.   EBL: minimal  COMPLICATIONS: None immediate.   INDICATIONS FOR PROCEDURE:  The patient, Julie [name], is a 48 y.o. female born on Oct 01, 1965, is here for revision reconstruction following nipple sparing mastectomies and expander placement. She has experienced ischemia of left mastectomy flap that has progressed to full thickness necrosis and exposure of tissue expander.    FINDINGS: Full thickness necrosis of lower pole mastectomy skin with preservation of nipple areolar complex. Any remaining acellular dermis in area of necrotic skin flap resected. [place] Artoura High Profile 300 ml tissue expander placed, Ref [id number].  Initial fill volume 60 ml.   DESCRIPTION OF PROCEDURE:  The patient's operative site was marked with the patient in the preoperative area. The patient was taken to the operating room. SCDs were placed and IV antibiotics were given. The patient's operative site was prepped and draped in a sterile fashion. A time out was performed and all information was confirmed to be correct. Sharp excision of all necrotic material completed with scissors, an area of approximately 6 x 5 cm was resected. The expander was grossly exposed and I elected to remove and replace her expander with a smaller size. Any exposed or nonincorporated acellular dermis was removed. Sub pectoral dissection completed superiorly and medially. The cavity was  irrigated with solution containing Ancef, gentamicin, and bacitracin. Flex HD was prepared and perforated and inset to inferior border of pectoralis muscle and inframammary fold with interrupted 3-0 vicryl. Expander was prepared in placed in cavity. Remainder of acellular dermis attached laterally to chest wall and mastectomy flaps. A new 15 Fr JP was placed in cavity and secured with 2-0 nylon. The remaining mastectomy flaps were then brought together in midline and along areola, creating a Y-shaped scar over lower pole breast. Closure completed with 4-0 vicryl in dermis and interrupted and short running 4-0 nylon for skin closure. Tissue glue applied followed by steri strips. Dry dressing and breast binder applied.   The patient was allowed to wake from anesthesia, extubated and taken to the recovery room in satisfactory condition.   SPECIMENS: none  DRAINS: 15 Fr JP in left reconstructed breast  Irene Limbo, MD [facility] Plastic & Reconstructive Surgery [phone]

## 2014-05-07 NOTE — Anesthesia Postprocedure Evaluation (Signed)
  Anesthesia Post-op Note  Patient: Julie [name]  Procedure(s) Performed: Procedure(s): DEBRIDEMENT LEFT BREAST RECONSTRUCTION WITH PLACEMENT OF TISSUE EXPANDER AND FLEX HD (ACELLULAR HYDRATED DERMIS) (Left)  Patient Location: PACU  Anesthesia Type:General  Level of Consciousness: awake, alert  and oriented  Airway and Oxygen Therapy: Patient Spontanous Breathing and Patient connected to nasal cannula oxygen  Post-op Pain: mild  Post-op Assessment: Post-op Vital signs reviewed, Patient's Cardiovascular Status Stable, Respiratory Function Stable, Patent Airway, No signs of Nausea or vomiting and Pain level controlled  Post-op Vital Signs: stable  Last Vitals:  Filed Vitals:   05/07/14 1910  BP: 102/65  Pulse: 84  Temp: 36.7 C  Resp: 18    Complications: No apparent anesthesia complications

## 2014-05-07 NOTE — Transfer of Care (Signed)
Immediate Anesthesia Transfer of Care Note  Patient: Julie [name]  Procedure(s) Performed: Procedure(s): DEBRIDEMENT LEFT BREAST RECONSTRUCTION WITH PLACEMENT OF TISSUE EXPANDER AND FLEX HD (ACELLULAR HYDRATED DERMIS) (Left)  Patient Location: PACU  Anesthesia Type:General  Level of Consciousness: sedated  Airway & Oxygen Therapy: Patient Spontanous Breathing and Patient connected to nasal cannula oxygen  Post-op Assessment: Report given to PACU RN and Post -op Vital signs reviewed and stable  Post vital signs: Reviewed and stable  Complications: No apparent anesthesia complications

## 2014-05-07 NOTE — Anesthesia Preprocedure Evaluation (Addendum)
Anesthesia Evaluation  Patient identified by MRN, date of birth, ID band Patient awake    Reviewed: Allergy & Precautions, H&P , NPO status , Patient's Chart, lab work & pertinent test results  History of Anesthesia Complications (+) PONV and history of anesthetic complications  Airway Mallampati: II       Dental  (+) Teeth Intact, Dental Advidsory Given   Pulmonary          Cardiovascular     Neuro/Psych  Neuromuscular disease    GI/Hepatic   Endo/Other    Renal/GU      Musculoskeletal  (+) Arthritis -, Fibromyalgia -  Abdominal   Peds  Hematology  (+) anemia ,   Anesthesia Other Findings   Reproductive/Obstetrics                            Anesthesia Physical Anesthesia Plan  ASA: II  Anesthesia Plan: General   Post-op Pain Management:    Induction: Intravenous  Airway Management Planned: Oral ETT  Additional Equipment:   Intra-op Plan:   Post-operative Plan: Extubation in OR  Informed Consent: I have reviewed the patients History and Physical, chart, labs and discussed the procedure including the risks, benefits and alternatives for the proposed anesthesia with the patient or authorized representative who has indicated his/her understanding and acceptance.   Dental Advisory Given  Plan Discussed with: Anesthesiologist, CRNA and Surgeon  Anesthesia Plan Comments:        Anesthesia Quick Evaluation

## 2014-05-07 NOTE — Anesthesia Procedure Notes (Signed)
Procedure Name: Intubation Date/Time: 05/07/2014 3:17 PM Performed by: Eligha Bridegroom Pre-anesthesia Checklist: Patient identified, Timeout performed, Emergency Drugs available, Suction available and Patient being monitored Patient Re-evaluated:Patient Re-evaluated prior to inductionOxygen Delivery Method: Circle system utilized Preoxygenation: Pre-oxygenation with 100% oxygen Intubation Type: IV induction Ventilation: Mask ventilation without difficulty Laryngoscope Size: Mac and 3 Grade View: Grade I Tube type: Oral Tube size: 7.0 mm Number of attempts: 1 Airway Equipment and Method: Stylet Placement Confirmation: ETT inserted through vocal cords under direct vision,  breath sounds checked- equal and bilateral and positive ETCO2 Secured at: 21 cm Tube secured with: Tape Dental Injury: Teeth and Oropharynx as per pre-operative assessment

## 2014-05-09 ENCOUNTER — Encounter (HOSPITAL_COMMUNITY): Payer: Self-pay | Admitting: Plastic Surgery

## 2014-05-12 ENCOUNTER — Encounter: Payer: Self-pay | Admitting: *Deleted

## 2014-05-12 ENCOUNTER — Encounter (HOSPITAL_COMMUNITY): Payer: Self-pay

## 2014-05-12 NOTE — Progress Notes (Signed)
Received oncotype score of 9/7%. Copy given to Dr. Lindi Adie, original sent to HIM.

## 2014-05-13 ENCOUNTER — Telehealth: Payer: Self-pay | Admitting: Hematology and Oncology

## 2014-05-13 ENCOUNTER — Ambulatory Visit (HOSPITAL_BASED_OUTPATIENT_CLINIC_OR_DEPARTMENT_OTHER): Payer: BC Managed Care – PPO | Admitting: Hematology and Oncology

## 2014-05-13 VITALS — BP 117/77 | HR 74 | Temp 98.9°F | Resp 18 | Ht 64.5 in | Wt 116.4 lb

## 2014-05-13 DIAGNOSIS — Z170 Estrogen receptor positive status [ER+]: Secondary | ICD-10-CM

## 2014-05-13 DIAGNOSIS — C50212 Malignant neoplasm of upper-inner quadrant of left female breast: Secondary | ICD-10-CM

## 2014-05-13 DIAGNOSIS — Z79810 Long term (current) use of selective estrogen receptor modulators (SERMs): Secondary | ICD-10-CM

## 2014-05-13 NOTE — Progress Notes (Signed)
Patient Care Team: Maisie Fus, MD as PCP - General (Obstetrics and Gynecology)  DIAGNOSIS: Breast cancer of upper-inner quadrant of left female breast   Staging form: Breast, AJCC 7th Edition     Clinical: Stage IA (T1b(2), N0, M0) - Unsigned   SUMMARY OF ONCOLOGIC HISTORY:   Breast cancer of upper-inner quadrant of left female breast   02/25/2014 Initial Biopsy Left breast mass 11:00: Invasive ductal carcinoma grade 1, ER 100%, PR 99%, Ki-67 14%, HER-2 negative ratio 1.5   03/05/2014 Breast MRI Left breast 11:00, 10 x 7 x 7 mm, in addition 7 x 5 x 6 mm masslike area, negative lymph nodes   04/11/2014 Surgery Bilateral mastectomies: Left breast: Multifocal IDC 0.8 cm and 0.9 cm with DCIS 4 SLN negative, bilateral nipple biopsy is negative: Right breast mastectomy no malignancy 2 lymph nodes negative: ER/PR positive HER-2 negative Ki-67 14-16%    Procedure Genetic testing did not reveal any mutations    CHIEF COMPLIANT: followup of Oncotype DX test  INTERVAL HISTORY: Julie [name] is a 48 year old Caucasian with above-mentioned history of left-sided breast cancer that was ER/PR positive and she underwent bilateral mastectomies. The left breast showed multifocal invasive ductal carcinoma and Oncotype DX was requested and she is here today to discuss the results of the test. She continues to have drains and it bothers her to fall asleep. She has been on oral tamoxifen and appears to be tolerating it very well without any major problems. She denies any hot flashes.  REVIEW OF SYSTEMS:   Constitutional: Denies fevers, chills or abnormal weight loss Eyes: Denies blurriness of vision Ears, nose, mouth, throat, and face: Denies mucositis or sore throat Respiratory: Denies cough, dyspnea or wheezes Cardiovascular: Denies palpitation, chest discomfort or lower extremity swelling Gastrointestinal:  Denies nausea, heartburn or change in bowel habits Skin: Denies abnormal skin rashes Lymphatics:  Denies new lymphadenopathy or easy bruising Neurological:Denies numbness, tingling or new weaknesses Behavioral/Psych: Mood is stable, no new changes  Breast: undergoing breast reconstruction with drains All other systems were reviewed with the patient and are negative.  I have reviewed the past medical history, past surgical history, social history and family history with the patient and they are unchanged from previous note.  ALLERGIES:  is allergic to codeine and hydrocodone.  MEDICATIONS:  Current Outpatient Prescriptions  Medication Sig Dispense Refill  . calcium carbonate (TUMS - DOSED IN MG ELEMENTAL CALCIUM) 500 MG chewable tablet Chew 1 tablet by mouth daily as needed for indigestion or heartburn.    . diazepam (VALIUM) 5 MG tablet Take 1 tablet (5 mg total) by mouth every 8 (eight) hours as needed for anxiety or muscle spasms. 30 tablet 0  . FLUoxetine (PROZAC) 20 MG capsule Take 20 mg by mouth daily.    Marland Kitchen loratadine (CLARITIN) 10 MG tablet Take 10 mg by mouth daily as needed for allergies.    Marland Kitchen omeprazole (PRILOSEC) 20 MG capsule Take 20 mg by mouth daily.  3  . sulfamethoxazole-trimethoprim (BACTRIM DS,SEPTRA DS) 800-160 MG per tablet Take 1 tablet by mouth 2 (two) times daily. 10 tablet 0  . tamoxifen (NOLVADEX) 20 MG tablet Take 1 tablet (20 mg total) by mouth daily. (Patient taking differently: Take 20 mg by mouth at bedtime. ) 90 tablet 3   No current facility-administered medications for this visit.    PHYSICAL EXAMINATION: ECOG PERFORMANCE STATUS: 1 - Symptomatic but completely ambulatory  Filed Vitals:   05/13/14 1351  BP: 117/77  Pulse: 74  Temp: 98.9 F (37.2 C)  Resp: 18   Filed Weights   05/13/14 1351  Weight: 116 lb 6.4 oz (52.799 kg)    GENERAL:alert, no distress and comfortable SKIN: skin color, texture, turgor are normal, no rashes or significant lesions EYES: normal, Conjunctiva are pink and non-injected, sclera clear OROPHARYNX:no exudate, no  erythema and lips, buccal mucosa, and tongue normal  NECK: supple, thyroid normal size, non-tender, without nodularity LYMPH:  no palpable lymphadenopathy in the cervical, axillary or inguinal LUNGS: clear to auscultation and percussion with normal breathing effort HEART: regular rate & rhythm and no murmurs and no lower extremity edema ABDOMEN:abdomen soft, non-tender and normal bowel sounds Musculoskeletal:no cyanosis of digits and no clubbing  NEURO: alert & oriented x 3 with fluent speech, no focal motor/sensory deficits  LABORATORY DATA:  I have reviewed the data as listed   Chemistry      Component Value Date/Time   NA 140 05/07/2014 1238   K 4.2 05/07/2014 1238   CL 106 05/07/2014 1238   CO2 23 05/07/2014 1238   BUN 8 05/07/2014 1238   CREATININE 0.85 05/07/2014 1238      Component Value Date/Time   CALCIUM 8.5 05/07/2014 1238       Lab Results  Component Value Date   WBC 4.2 05/07/2014   HGB 11.0* 05/07/2014   HCT 34.5* 05/07/2014   MCV 82.9 05/07/2014   PLT 218 05/07/2014   ASSESSMENT & PLAN:  Breast cancer of upper-inner quadrant of left female breast Left breast multifocal invasive ductal carcinoma 0.8 cm and 0.9 cm, ER/PR positive HER-2 negative (ratio is 0.97 and 1.23) Ki-67 ranged 14-16% status post bilateral mastectomies 4 SLN negative.Oncotype DX recurrence score 9, 7 percent risk of recurrence; did not need chemotherapy currently on tamoxifen plan is for 10 years  Oncotype DX counseling: I reviewed the results of the Oncotype DX test which showed that she had low risk of recurrence with a score of 9, which suggests 7% risk of distant recurrence with tamoxifen. She tends she does not benefit from systemic chemotherapy.  Tamoxifen counseling: Patient appears to be tolerating tamoxifen fairly well without any major problems or concerns. She denies any hot flashes. She has been on Prozac and I discussed with her that if it was possible she might want to talk to  her primary care physician and switch her to Effexor instead. There is a theoretical interaction between tamoxifen and Prozac as Prozac may decrease the effectiveness of tamoxifen.  Return to clinic in 3 months for followup.   No orders of the defined types were placed in this encounter.   The patient has a good understanding of the overall plan. she agrees with it. She will call with any problems that may develop before her next visit here.   Rulon Eisenmenger, MD 05/13/2014 2:34 PM

## 2014-05-13 NOTE — Assessment & Plan Note (Signed)
Left breast multifocal invasive ductal carcinoma 0.8 cm and 0.9 cm, ER/PR positive HER-2 negative (ratio is 0.97 and 1.23) Ki-67 ranged 14-16% status post bilateral mastectomies 4 SLN negative.Oncotype DX recurrence score 9, 7 percent risk of recurrence; did not need chemotherapy currently on tamoxifen plan is for 10 years  Oncotype DX counseling: I reviewed the results of the Oncotype DX test which showed that she had low risk of recurrence with a score of 9, which suggests 7% risk of distant recurrence with tamoxifen. She tends she does not benefit from systemic chemotherapy.  Tamoxifen counseling: Patient appears to be tolerating tamoxifen fairly well without any major problems or concerns. She denies any hot flashes. She has been on Prozac and I discussed with her that if it was possible she might want to talk to her primary care physician and switch her to Effexor instead. There is a theoretical interaction between tamoxifen and Prozac as Prozac may decrease the effectiveness of tamoxifen.  Return to clinic in 3 months for followup.

## 2014-05-15 ENCOUNTER — Encounter: Payer: Self-pay | Admitting: *Deleted

## 2014-05-15 NOTE — Progress Notes (Signed)
Received Oncotype Dx results of 9.  Placed a copy in Dr. Gudena's box and took a copy to HIM to scan.  

## 2014-06-03 ENCOUNTER — Encounter (HOSPITAL_BASED_OUTPATIENT_CLINIC_OR_DEPARTMENT_OTHER): Payer: Self-pay | Admitting: *Deleted

## 2014-06-03 NOTE — H&P (Signed)
  Subjective:    Patient ID: Julie [name] is a 48 y.o. female.  HPI 8 weeks post op bilateral NSM with TE/ADM reconsruction. Suffered mastectomy flap necrosis on left and 4 weeks post debridement with replacement smaller TE and additional ADM. Left breast drain removed last visit. Has reported drainage from this breast, fever on Christmas day. Started on bactrim over weekend.   Oncotype 9, no benefit chemotherapy. Final path with benign right breast, left breast with two foci IDC 0.8 and 0.9 cm. 0/4 SLN. Genetic testing negative. Patient presented following MMG with left breast 7 mm mass at 11 o clock, posterior third. [place] concordant and biospy demonstrated IDC. Underwent MRI and R breast benign, left with additional oval mass at 11 o clock middle third. Bx of second area IDC ER/PR+ Her 2 -.   Prior 36 C, deisres Full C Mastectomy weight left 382 g Right 373 g  Review of Systems     Objective:    Physical Exam  Cardiovascular: Normal rate and regular rhythm.  Pulmonary/Chest: Effort normal and breath sounds normal.      Right breast soft, 1cm dark area lower pole Left chest: Pinpoint hole below NAC draining serous fluid, cellulitis inferior poles Assessment:      Seroma, cellulitis Left breast cancer  S/p bilat NSM with TE/ADM reconstruction    Plan:      Seroma left reconstructed breast with cellulitis, spontaneous drainage. Plan OR for wash out, debridement, exchange of expander if needed. Will utilize IMF scar, counseled I may need to excise any nonincorporated acellular dermis present as this will prevent resolution infection. OR tomorrow.   Mentor 375 ml Artoura High Profile expanders Right R Initial fill volume 295 ml   Mentor 300 ml Artoura High Profile expander Left L Initial fill volume 135 ml  Irene Limbo, MD [facility] Plastic & Reconstructive Surgery [phone]

## 2014-06-03 NOTE — Progress Notes (Signed)
No labs needed

## 2014-06-04 ENCOUNTER — Ambulatory Visit (HOSPITAL_BASED_OUTPATIENT_CLINIC_OR_DEPARTMENT_OTHER)
Admission: RE | Admit: 2014-06-04 | Discharge: 2014-06-04 | Disposition: A | Discharge status: Home / Self Care | Payer: BC Managed Care – PPO | Source: Ambulatory Visit | Attending: Plastic Surgery | Admitting: Plastic Surgery

## 2014-06-04 ENCOUNTER — Ambulatory Visit (HOSPITAL_BASED_OUTPATIENT_CLINIC_OR_DEPARTMENT_OTHER): Payer: BC Managed Care – PPO | Admitting: Anesthesiology

## 2014-06-04 ENCOUNTER — Encounter (HOSPITAL_BASED_OUTPATIENT_CLINIC_OR_DEPARTMENT_OTHER)
Admission: RE | Disposition: A | Discharge status: Home / Self Care | Payer: Self-pay | Source: Ambulatory Visit | Attending: Plastic Surgery

## 2014-06-04 ENCOUNTER — Encounter (HOSPITAL_BASED_OUTPATIENT_CLINIC_OR_DEPARTMENT_OTHER): Payer: Self-pay | Admitting: *Deleted

## 2014-06-04 DIAGNOSIS — L7621 Postprocedural hemorrhage and hematoma of skin and subcutaneous tissue following a dermatologic procedure: Secondary | ICD-10-CM | POA: Insufficient documentation

## 2014-06-04 DIAGNOSIS — Z9013 Acquired absence of bilateral breasts and nipples: Secondary | ICD-10-CM | POA: Diagnosis not present

## 2014-06-04 DIAGNOSIS — Z853 Personal history of malignant neoplasm of breast: Secondary | ICD-10-CM | POA: Insufficient documentation

## 2014-06-04 DIAGNOSIS — C50912 Malignant neoplasm of unspecified site of left female breast: Secondary | ICD-10-CM | POA: Diagnosis not present

## 2014-06-04 DIAGNOSIS — N61 Inflammatory disorders of breast: Secondary | ICD-10-CM | POA: Diagnosis present

## 2014-06-04 DIAGNOSIS — Z170 Estrogen receptor positive status [ER+]: Secondary | ICD-10-CM | POA: Insufficient documentation

## 2014-06-04 HISTORY — DX: Presence of spectacles and contact lenses: Z97.3

## 2014-06-04 HISTORY — PX: TISSUE EXPANDER PLACEMENT: SHX2530

## 2014-06-04 HISTORY — PX: REMOVAL OF TISSUE EXPANDER: SHX6324

## 2014-06-04 LAB — POCT HEMOGLOBIN-HEMACUE: HEMOGLOBIN: 11.6 g/dL — AB (ref 12.0–15.0)

## 2014-06-04 SURGERY — INSERTION, TISSUE EXPANDER
Anesthesia: General | Site: Breast | Laterality: Left

## 2014-06-04 MED ORDER — MIDAZOLAM HCL 2 MG/2ML IJ SOLN: 1.0000 mg | INTRAMUSCULAR | Status: DC | PRN

## 2014-06-04 MED ORDER — HYDROMORPHONE HCL 1 MG/ML IJ SOLN
INTRAMUSCULAR | Status: AC
  Filled 2014-06-04: qty 1

## 2014-06-04 MED ORDER — MIDAZOLAM HCL 2 MG/2ML IJ SOLN
INTRAMUSCULAR | Status: AC
  Filled 2014-06-04: qty 2

## 2014-06-04 MED ORDER — CIPROFLOXACIN HCL 500 MG PO TABS: 500.0000 mg | ORAL_TABLET | Freq: Two times a day (BID) | ORAL | Status: DC

## 2014-06-04 MED ORDER — FENTANYL CITRATE 0.05 MG/ML IJ SOLN
INTRAMUSCULAR | Status: DC | PRN
  Administered 2014-06-04: 100 ug via INTRAVENOUS

## 2014-06-04 MED ORDER — VANCOMYCIN HCL 1000 MG IV SOLR
1000.0000 mg | INTRAVENOUS | Status: DC | PRN
  Administered 2014-06-04: 1000 mg via INTRAVENOUS

## 2014-06-04 MED ORDER — HYDROMORPHONE HCL 1 MG/ML IJ SOLN
0.2500 mg | INTRAMUSCULAR | Status: DC | PRN
  Administered 2014-06-04: 0.25 mg via INTRAVENOUS
  Administered 2014-06-04 (×2): 0.5 mg via INTRAVENOUS
  Administered 2014-06-04: 0.25 mg via INTRAVENOUS

## 2014-06-04 MED ORDER — OXYCODONE HCL 5 MG PO TABS
ORAL_TABLET | ORAL | Status: AC
  Filled 2014-06-04: qty 1

## 2014-06-04 MED ORDER — VANCOMYCIN HCL IN DEXTROSE 500-5 MG/100ML-% IV SOLN
INTRAVENOUS | Status: AC
  Filled 2014-06-04: qty 200

## 2014-06-04 MED ORDER — LIDOCAINE HCL (CARDIAC) 20 MG/ML IV SOLN
INTRAVENOUS | Status: DC | PRN
  Administered 2014-06-04: 50 mg via INTRAVENOUS

## 2014-06-04 MED ORDER — MIDAZOLAM HCL 5 MG/5ML IJ SOLN
INTRAMUSCULAR | Status: DC | PRN
  Administered 2014-06-04: 2 mg via INTRAVENOUS

## 2014-06-04 MED ORDER — VANCOMYCIN HCL IN DEXTROSE 1-5 GM/200ML-% IV SOLN: 1000.0000 mg | INTRAVENOUS | Status: DC

## 2014-06-04 MED ORDER — FENTANYL CITRATE 0.05 MG/ML IJ SOLN: 50.0000 ug | INTRAMUSCULAR | Status: DC | PRN

## 2014-06-04 MED ORDER — PROPOFOL 10 MG/ML IV BOLUS
INTRAVENOUS | Status: DC | PRN
  Administered 2014-06-04: 200 mg via INTRAVENOUS

## 2014-06-04 MED ORDER — SCOPOLAMINE 1 MG/3DAYS TD PT72
1.0000 | MEDICATED_PATCH | TRANSDERMAL | Status: DC
  Administered 2014-06-04: 1.5 mg via TRANSDERMAL

## 2014-06-04 MED ORDER — SODIUM CHLORIDE 0.9 % IV SOLN
INTRAVENOUS | Status: DC | PRN
  Administered 2014-06-04: 09:00:00

## 2014-06-04 MED ORDER — LACTATED RINGERS IV SOLN
INTRAVENOUS | Status: DC
  Administered 2014-06-04: 08:00:00 via INTRAVENOUS

## 2014-06-04 MED ORDER — CHLORHEXIDINE GLUCONATE 4 % EX LIQD: 1.0000 "application " | Freq: Once | CUTANEOUS | Status: DC

## 2014-06-04 MED ORDER — ONDANSETRON HCL 4 MG/2ML IJ SOLN: 4.0000 mg | Freq: Once | INTRAMUSCULAR | Status: DC | PRN

## 2014-06-04 MED ORDER — OXYCODONE HCL 5 MG/5ML PO SOLN: 5.0000 mg | Freq: Once | ORAL | Status: AC | PRN

## 2014-06-04 MED ORDER — DEXAMETHASONE SODIUM PHOSPHATE 4 MG/ML IJ SOLN
INTRAMUSCULAR | Status: DC | PRN
  Administered 2014-06-04: 10 mg via INTRAVENOUS

## 2014-06-04 MED ORDER — FENTANYL CITRATE 0.05 MG/ML IJ SOLN
INTRAMUSCULAR | Status: AC
  Filled 2014-06-04: qty 6

## 2014-06-04 MED ORDER — OXYCODONE HCL 5 MG PO TABS
5.0000 mg | ORAL_TABLET | Freq: Once | ORAL | Status: AC | PRN
  Administered 2014-06-04: 5 mg via ORAL

## 2014-06-04 SURGICAL SUPPLY — 56 items
BAG DECANTER FOR FLEXI CONT (MISCELLANEOUS) ×2 IMPLANT
BINDER BREAST LRG (GAUZE/BANDAGES/DRESSINGS) IMPLANT
BINDER BREAST MEDIUM (GAUZE/BANDAGES/DRESSINGS) ×1 IMPLANT
BINDER BREAST XLRG (GAUZE/BANDAGES/DRESSINGS) IMPLANT
BINDER BREAST XXLRG (GAUZE/BANDAGES/DRESSINGS) IMPLANT
BLADE SURG 15 STRL LF DISP TIS (BLADE) ×1 IMPLANT
BLADE SURG 15 STRL SS (BLADE) ×2
BNDG GAUZE ELAST 4 BULKY (GAUZE/BANDAGES/DRESSINGS) IMPLANT
CANISTER SUCT 1200ML W/VALVE (MISCELLANEOUS) ×3 IMPLANT
CHLORAPREP W/TINT 26ML (MISCELLANEOUS) ×1 IMPLANT
COVER BACK TABLE 60X90IN (DRAPES) ×2 IMPLANT
COVER MAYO STAND STRL (DRAPES) ×2 IMPLANT
DECANTER SPIKE VIAL GLASS SM (MISCELLANEOUS) IMPLANT
DRAIN CHANNEL 15F RND FF W/TCR (WOUND CARE) ×1 IMPLANT
DRAPE LAPAROSCOPIC ABDOMINAL (DRAPES) ×1 IMPLANT
DRAPE U-SHAPE 76X120 STRL (DRAPES) IMPLANT
DRSG PAD ABDOMINAL 8X10 ST (GAUZE/BANDAGES/DRESSINGS) ×2 IMPLANT
ELECT BLADE 4.0 EZ CLEAN MEGAD (MISCELLANEOUS) ×2
ELECT COATED BLADE 2.86 ST (ELECTRODE) ×2 IMPLANT
ELECT REM PT RETURN 9FT ADLT (ELECTROSURGICAL) ×2
ELECTRODE BLDE 4.0 EZ CLN MEGD (MISCELLANEOUS) ×1 IMPLANT
ELECTRODE REM PT RTRN 9FT ADLT (ELECTROSURGICAL) ×1 IMPLANT
EVACUATOR SILICONE 100CC (DRAIN) ×1 IMPLANT
EXPANDER BREAST 300CC (Expander) ×1 IMPLANT
GLOVE BIO SURGEON STRL SZ 6 (GLOVE) ×4 IMPLANT
GLOVE BIO SURGEON STRL SZ 6.5 (GLOVE) ×1 IMPLANT
GOWN STRL REUS W/ TWL LRG LVL3 (GOWN DISPOSABLE) ×2 IMPLANT
GOWN STRL REUS W/TWL LRG LVL3 (GOWN DISPOSABLE) ×4
IV NS 500ML (IV SOLUTION) ×2
IV NS 500ML BAXH (IV SOLUTION) ×1 IMPLANT
KIT FILL SYSTEM UNIVERSAL (SET/KITS/TRAYS/PACK) ×2 IMPLANT
LIQUID BAND (GAUZE/BANDAGES/DRESSINGS) ×2 IMPLANT
NDL PRECISIONGLIDE 27X1.5 (NEEDLE) IMPLANT
NEEDLE PRECISIONGLIDE 27X1.5 (NEEDLE) IMPLANT
PACK BASIN DAY SURGERY FS (CUSTOM PROCEDURE TRAY) ×2 IMPLANT
PENCIL BUTTON HOLSTER BLD 10FT (ELECTRODE) IMPLANT
PIN SAFETY STERILE (MISCELLANEOUS) ×2 IMPLANT
SLEEVE SCD COMPRESS KNEE MED (MISCELLANEOUS) ×2 IMPLANT
SPONGE LAP 18X18 X RAY DECT (DISPOSABLE) ×3 IMPLANT
STAPLER VISISTAT 35W (STAPLE) ×2 IMPLANT
SUCTION FRAZIER TIP 10 FR DISP (SUCTIONS) IMPLANT
SUT ETHILON 2 0 FS 18 (SUTURE) IMPLANT
SUT ETHILON 3 0 PS 1 (SUTURE) ×1 IMPLANT
SUT ETHILON 4 0 PS 2 18 (SUTURE) ×1 IMPLANT
SUT MNCRL AB 4-0 PS2 18 (SUTURE) IMPLANT
SUT VIC AB 3-0 PS1 18 (SUTURE)
SUT VIC AB 3-0 PS1 18XBRD (SUTURE) IMPLANT
SUT VIC AB 3-0 SH 27 (SUTURE)
SUT VIC AB 3-0 SH 27X BRD (SUTURE) IMPLANT
SUT VICRYL 4-0 PS2 18IN ABS (SUTURE) ×2 IMPLANT
SYR BULB IRRIGATION 50ML (SYRINGE) ×2 IMPLANT
SYR CONTROL 10ML LL (SYRINGE) IMPLANT
TOWEL OR 17X24 6PK STRL BLUE (TOWEL DISPOSABLE) ×4 IMPLANT
TUBE CONNECTING 20X1/4 (TUBING) ×2 IMPLANT
UNDERPAD 30X30 INCONTINENT (UNDERPADS AND DIAPERS) ×4 IMPLANT
YANKAUER SUCT BULB TIP NO VENT (SUCTIONS) ×2 IMPLANT

## 2014-06-04 NOTE — Anesthesia Procedure Notes (Signed)
Procedure Name: LMA Insertion Date/Time: 06/04/2014 8:28 AM Performed by: Melynda Ripple D Pre-anesthesia Checklist: Patient identified, Emergency Drugs available, Suction available and Patient being monitored Patient Re-evaluated:Patient Re-evaluated prior to inductionOxygen Delivery Method: Circle System Utilized Preoxygenation: Pre-oxygenation with 100% oxygen Intubation Type: IV induction Ventilation: Mask ventilation without difficulty LMA: LMA inserted LMA Size: 3.0 Number of attempts: 1 Airway Equipment and Method: bite block Placement Confirmation: positive ETCO2 Tube secured with: Tape Dental Injury: Teeth and Oropharynx as per pre-operative assessment

## 2014-06-04 NOTE — Discharge Instructions (Signed)
Post Anesthesia Home Care Instructions  Activity: Get plenty of rest for the remainder of the day. A responsible adult should stay with you for 24 hours following the procedure.  For the next 24 hours, DO NOT: -Drive a car -Paediatric nurse -Drink alcoholic beverages -Take any medication unless instructed by your physician -Make any legal decisions or sign important papers.  Meals: Start with liquid foods such as gelatin or soup. Progress to regular foods as tolerated. Avoid greasy, spicy, heavy foods. If nausea and/or vomiting occur, drink only clear liquids until the nausea and/or vomiting subsides. Call your physician if vomiting continues.  Special Instructions/Symptoms: Your throat may feel dry or sore from the anesthesia or the breathing tube placed in your throat during surgery. If this causes discomfort, gargle with warm salt water. The discomfort should disappear within 24 hours.    JP Drain [employer] this sheet to all of your post-operative appointments while you have your drains.  Please measure your drains by CC's or ML's.  Make sure you drain and measure your JP Drains 3 times per day.  At the end of each day, add up totals for the left side and add up totals for the right side.    ( 9 am )     ( 3 pm )        ( 9 pm )                Date L  R  L  R  L  R  Total L/R                                                                                                                                                                                       About my Jackson-Pratt Bulb Drain  What is a Jackson-Pratt bulb? A Jackson-Pratt is a soft, round device used to collect drainage. It is connected to a long, thin drainage catheter, which is held in place by one or two small stiches near your surgical incision site. When the bulb is squeezed, it forms a vacuum, forcing the drainage to empty into the bulb.  Emptying the Jackson-Pratt bulb- To empty the  bulb: 1. Release the plug on the top of the bulb. 2. Pour the bulb's contents into a measuring container which your nurse will provide. 3. Record the time emptied and amount of drainage. Empty the drain(s) as often as your     doctor or nurse recommends.  Date                  Time  Amount (Drain 1)                 Amount (Drain 2)    Squeezing the Jackson-Pratt Bulb- To squeeze the bulb: 1. Make sure the plug at the top of the bulb is open. 2. Squeeze the bulb tightly in your fist. You will hear air squeezing from the bulb. 3. Replace the plug while the bulb is squeezed. 4. Use a safety pin to attach the bulb to your clothing. This will keep the catheter from     pulling at the bulb insertion site.  When to call your doctor- Call your doctor if:  Drain site becomes red, swollen or hot.  You have a fever greater than 101 degrees F.  There is oozing at the drain site.  Drain falls out (apply a guaze bandage over the drain hole and secure it with tape).  Drainage increases daily not related to activity patterns. (You will usually have more drainage when you are active than when you are resting.)  Drainage has a bad odor.  Call your surgeon if you experience:   1.  Fever over 101.0. 2.  Inability to urinate. 3.  Nausea and/or vomiting. 4.  Extreme swelling or bruising at the surgical site. 5.  Continued bleeding from the incision. 6.  Increased pain, redness or drainage from the incision. 7.  Problems related to your pain medication. 8. Any change in color, movement and/or sensation 9. Any problems and/or concerns

## 2014-06-04 NOTE — Op Note (Signed)
Operative Note   DATE OF OPERATION: 12.30.2015  LOCATION: [facility]-outpatient  SURGICAL DIVISION: Plastic Surgery  PREOPERATIVE DIAGNOSES:  1. Acquired absence bilateral breasts 2. History breast cancer 3. Seroma complicating procedure (left)  POSTOPERATIVE DIAGNOSES:  same  PROCEDURE:  1. Debridement left reconstructed breast, drainage seroma, and exchange of tissue expander.   SURGEON: Irene Limbo MD MBA  ASSISTANT: none  ANESTHESIA:  General.   EBL: 578 ml  COMPLICATIONS: None immediate.   INDICATIONS FOR PROCEDURE:  The patient, Julie [name], is a 48 y.o. female born on 06/17/1965, is here for treatment of left breast seroma following drain removal less than 1 week ago. She suffered mastectomy flap necrosis following nipple sparing mastectomy and required debridement earlier this month.   FINDINGS: Mentor Artoura 300 ml High profile tissue expander placed. Initial fill volume 100 ml. Ref [id number] SN [id number]  DESCRIPTION OF PROCEDURE:  The patient's operative site was marked with the patient in the preoperative area. The patient was taken to the operating room. SCDs were placed and IV antibiotics were given. The patient's operative site was prepped and draped in a sterile fashion. A time out was performed and all information was confirmed to be correct.  Sharp debridement with knife performed of mastectomy flaps over lower pole where spontaneous drainage of fluid had occurred. This represented the scar line of her prior debridement. Incision then made in lateral inframammary fold scar and capsule entered. Clear seroma fluid drained. Expander removed. Examination of cavity with majority of acellular dermis incorporated. The few areas that did not demonstrate incorporation were excised with scissors. Capsule treated with curettage and irrigated with solution containing Ancef, gentamicin, and bacitracin. 15 Fr JP placed in cavity and secured to skin with 2-0 nylon. A  new expander was prepared and placed in cavity. Closure of inframammary scar completed with 3-0 vicryl in superficial fascia, 4-0 vicryl in dermis and 4-0 nylon for skin closure. The lower pole mastectomy wound closed with interrupted 4-0 vicryl in dermis and 4-0 nylon for skin closure. Tissue adhesive and steri strips applied. Expander port accessed and filled to 100 ml.   The patient was allowed to wake from anesthesia, extubated and taken to the recovery room in satisfactory condition.   SPECIMENS: none  DRAINS: 15 Fr JP in left reconstructed breast  Irene Limbo, MD [facility] Plastic & Reconstructive Surgery [phone]

## 2014-06-04 NOTE — Transfer of Care (Signed)
Immediate Anesthesia Transfer of Care Note  Patient: Julie [name]  Procedure(s) Performed: Procedure(s): [place] OUT LEFT RECONSTRUCTIVE BREAST  EXCHANGE OF TISSUE EXPANDER (Left) [place] OUT LEFT RECONSTRUCTIVE BREAST  EXCHANGE OF TISSUE EXPANDER (Left)  Patient Location: PACU  Anesthesia Type:General  Level of Consciousness: awake, alert , oriented and patient cooperative  Airway & Oxygen Therapy: Patient Spontanous Breathing and Patient connected to face mask oxygen  Post-op Assessment: Report given to PACU RN and Post -op Vital signs reviewed and stable  Post vital signs: Reviewed and stable  Complications: No apparent anesthesia complications

## 2014-06-04 NOTE — Interval H&P Note (Signed)
History and Physical Interval Note:  06/04/2014 7:51 AM  Julie [name]  has presented today for surgery, with the diagnosis of cellulitis left breast  The various methods of treatment have been discussed with the patient and family. After consideration of risks, benefits and other options for treatment, the patient has consented to  Procedure(s): [place] OUT LEFT REC ONSTRUCTIVE BREAST POSSIBLE EXCHANGE OF TISSUE EXPANDER (Left) as a surgical intervention .  The patient's history has been reviewed, patient examined, no change in status, stable for surgery.  I have reviewed the patient's chart and labs.  Questions were answered to the patient's satisfaction.     Janneth Krasner

## 2014-06-04 NOTE — Anesthesia Postprocedure Evaluation (Signed)
  Anesthesia Post-op Note  Patient: Julie [name]  Procedure(s) Performed: Procedure(s): [employer] OUT LEFT RECONSTRUCTIVE BREAST  EXCHANGE OF TISSUE EXPANDER (Left) [place] OUT LEFT RECONSTRUCTIVE BREAST  EXCHANGE OF TISSUE EXPANDER (Left)  Patient Location: PACU  Anesthesia Type: General   Level of Consciousness: awake, alert  and oriented  Airway and Oxygen Therapy: Patient Spontanous Breathing  Post-op Pain: mild  Post-op Assessment: Post-op Vital signs reviewed  Post-op Vital Signs: Reviewed  Last Vitals:  Filed Vitals:   06/04/14 1145  BP: 111/65  Pulse: 68  Temp: 37 C  Resp: 20    Complications: No apparent anesthesia complications

## 2014-06-04 NOTE — Anesthesia Preprocedure Evaluation (Signed)
Anesthesia Evaluation  Patient identified by MRN, date of birth, ID band Patient awake    Reviewed: Allergy & Precautions, H&P , NPO status , Patient's Chart, lab work & pertinent test results  History of Anesthesia Complications (+) PONV  Airway Mallampati: I  TM Distance: >3 FB Neck ROM: Full    Dental  (+) Teeth Intact, Dental Advisory Given   Pulmonary  breath sounds clear to auscultation        Cardiovascular Rhythm:Regular Rate:Normal     Neuro/Psych    GI/Hepatic   Endo/Other    Renal/GU      Musculoskeletal   Abdominal   Peds  Hematology   Anesthesia Other Findings   Reproductive/Obstetrics                             Anesthesia Physical Anesthesia Plan  ASA: II  Anesthesia Plan: General   Post-op Pain Management:    Induction: Intravenous  Airway Management Planned: LMA  Additional Equipment:   Intra-op Plan:   Post-operative Plan: Extubation in OR  Informed Consent: I have reviewed the patients History and Physical, chart, labs and discussed the procedure including the risks, benefits and alternatives for the proposed anesthesia with the patient or authorized representative who has indicated his/her understanding and acceptance.   Dental advisory given  Plan Discussed with: CRNA, Anesthesiologist and Surgeon  Anesthesia Plan Comments:         Anesthesia Quick Evaluation

## 2014-06-29 ENCOUNTER — Ambulatory Visit (HOSPITAL_COMMUNITY): Payer: BLUE CROSS/BLUE SHIELD | Admitting: Anesthesiology

## 2014-06-29 ENCOUNTER — Encounter (HOSPITAL_COMMUNITY)
Admission: AD | Disposition: A | Discharge status: Home / Self Care | Payer: Self-pay | Source: Ambulatory Visit | Attending: Plastic Surgery

## 2014-06-29 ENCOUNTER — Ambulatory Visit (HOSPITAL_COMMUNITY)
Admission: AD | Admit: 2014-06-29 | Discharge: 2014-06-29 | Disposition: A | Discharge status: Home / Self Care | Payer: BLUE CROSS/BLUE SHIELD | Source: Ambulatory Visit | Attending: Plastic Surgery | Admitting: Plastic Surgery

## 2014-06-29 ENCOUNTER — Encounter (HOSPITAL_COMMUNITY): Payer: Self-pay | Admitting: Anesthesiology

## 2014-06-29 DIAGNOSIS — Z170 Estrogen receptor positive status [ER+]: Secondary | ICD-10-CM | POA: Diagnosis not present

## 2014-06-29 DIAGNOSIS — M96830 Postprocedural hemorrhage and hematoma of a musculoskeletal structure following a musculoskeletal system procedure: Secondary | ICD-10-CM | POA: Insufficient documentation

## 2014-06-29 DIAGNOSIS — Y838 Other surgical procedures as the cause of abnormal reaction of the patient, or of later complication, without mention of misadventure at the time of the procedure: Secondary | ICD-10-CM | POA: Insufficient documentation

## 2014-06-29 DIAGNOSIS — Z853 Personal history of malignant neoplasm of breast: Secondary | ICD-10-CM | POA: Diagnosis not present

## 2014-06-29 DIAGNOSIS — Z9013 Acquired absence of bilateral breasts and nipples: Secondary | ICD-10-CM | POA: Diagnosis not present

## 2014-06-29 HISTORY — PX: REMOVAL OF BILATERAL TISSUE EXPANDERS WITH PLACEMENT OF BILATERAL BREAST IMPLANTS: SHX6431

## 2014-06-29 SURGERY — REMOVAL, TISSUE EXPANDER, BREAST, BILATERAL, WITH BILATERAL IMPLANT IMPLANT INSERTION
Anesthesia: General | Laterality: Right

## 2014-06-29 MED ORDER — HYDROMORPHONE HCL 1 MG/ML IJ SOLN
INTRAMUSCULAR | Status: AC
  Filled 2014-06-29: qty 1

## 2014-06-29 MED ORDER — CEFAZOLIN SODIUM-DEXTROSE 2-3 GM-% IV SOLR
INTRAVENOUS | Status: AC
  Administered 2014-06-29: 2 g via INTRAVENOUS
  Filled 2014-06-29: qty 50

## 2014-06-29 MED ORDER — SODIUM CHLORIDE 0.9 % IJ SOLN
INTRAMUSCULAR | Status: AC
  Filled 2014-06-29: qty 10

## 2014-06-29 MED ORDER — LIDOCAINE HCL (CARDIAC) 10 MG/ML IV SOLN
INTRAVENOUS | Status: DC | PRN
  Administered 2014-06-29: 60 mg via INTRAVENOUS

## 2014-06-29 MED ORDER — HYDROMORPHONE HCL 1 MG/ML IJ SOLN
0.2500 mg | INTRAMUSCULAR | Status: DC | PRN
  Administered 2014-06-29 (×3): 0.5 mg via INTRAVENOUS

## 2014-06-29 MED ORDER — ROCURONIUM BROMIDE 50 MG/5ML IV SOLN
INTRAVENOUS | Status: AC
  Filled 2014-06-29: qty 1

## 2014-06-29 MED ORDER — ONDANSETRON HCL 4 MG/2ML IJ SOLN
INTRAMUSCULAR | Status: DC | PRN
  Administered 2014-06-29: 4 mg via INTRAVENOUS

## 2014-06-29 MED ORDER — PHENYLEPHRINE 40 MCG/ML (10ML) SYRINGE FOR IV PUSH (FOR BLOOD PRESSURE SUPPORT)
PREFILLED_SYRINGE | INTRAVENOUS | Status: AC
  Filled 2014-06-29: qty 10

## 2014-06-29 MED ORDER — PROMETHAZINE HCL 25 MG/ML IJ SOLN
6.2500 mg | INTRAMUSCULAR | Status: DC | PRN
Start: 2014-06-29 — End: 2014-06-29

## 2014-06-29 MED ORDER — SUCCINYLCHOLINE CHLORIDE 20 MG/ML IJ SOLN
INTRAMUSCULAR | Status: AC
  Filled 2014-06-29: qty 1

## 2014-06-29 MED ORDER — FLUCONAZOLE 150 MG PO TABS: 150.0000 mg | ORAL_TABLET | Freq: Every day | ORAL | Status: DC

## 2014-06-29 MED ORDER — MIDAZOLAM HCL 5 MG/5ML IJ SOLN
INTRAMUSCULAR | Status: DC | PRN
  Administered 2014-06-29: 2 mg via INTRAVENOUS

## 2014-06-29 MED ORDER — EPHEDRINE SULFATE 50 MG/ML IJ SOLN
INTRAMUSCULAR | Status: AC
  Filled 2014-06-29: qty 1

## 2014-06-29 MED ORDER — PROPOFOL 10 MG/ML IV BOLUS
INTRAVENOUS | Status: AC
  Filled 2014-06-29: qty 20

## 2014-06-29 MED ORDER — FENTANYL CITRATE 0.05 MG/ML IJ SOLN
INTRAMUSCULAR | Status: DC | PRN
  Administered 2014-06-29: 100 ug via INTRAVENOUS

## 2014-06-29 MED ORDER — DEXAMETHASONE SODIUM PHOSPHATE 4 MG/ML IJ SOLN
INTRAMUSCULAR | Status: AC
  Filled 2014-06-29: qty 1

## 2014-06-29 MED ORDER — DEXAMETHASONE SODIUM PHOSPHATE 4 MG/ML IJ SOLN
INTRAMUSCULAR | Status: DC | PRN
  Administered 2014-06-29: 4 mg via INTRAVENOUS

## 2014-06-29 MED ORDER — MIDAZOLAM HCL 2 MG/2ML IJ SOLN
INTRAMUSCULAR | Status: AC
  Filled 2014-06-29: qty 2

## 2014-06-29 MED ORDER — PROPOFOL 10 MG/ML IV BOLUS
INTRAVENOUS | Status: DC | PRN
  Administered 2014-06-29: 150 mg via INTRAVENOUS

## 2014-06-29 MED ORDER — 0.9 % SODIUM CHLORIDE (POUR BTL) OPTIME
TOPICAL | Status: DC | PRN
  Administered 2014-06-29: 1000 mL

## 2014-06-29 MED ORDER — CEFAZOLIN SODIUM-DEXTROSE 2-3 GM-% IV SOLR: 2.0000 g | INTRAVENOUS | Status: DC

## 2014-06-29 MED ORDER — FENTANYL CITRATE 0.05 MG/ML IJ SOLN
INTRAMUSCULAR | Status: AC
  Filled 2014-06-29: qty 5

## 2014-06-29 MED ORDER — SCOPOLAMINE 1 MG/3DAYS TD PT72
MEDICATED_PATCH | TRANSDERMAL | Status: AC
  Administered 2014-06-29: 1 via TRANSDERMAL
  Filled 2014-06-29: qty 1

## 2014-06-29 MED ORDER — GLYCOPYRROLATE 0.2 MG/ML IJ SOLN
INTRAMUSCULAR | Status: AC
  Filled 2014-06-29: qty 1

## 2014-06-29 MED ORDER — ONDANSETRON HCL 4 MG/2ML IJ SOLN
INTRAMUSCULAR | Status: AC
  Filled 2014-06-29: qty 2

## 2014-06-29 MED ORDER — LACTATED RINGERS IV SOLN
INTRAVENOUS | Status: DC | PRN
  Administered 2014-06-29: 08:00:00 via INTRAVENOUS

## 2014-06-29 MED ORDER — LIDOCAINE HCL (CARDIAC) 20 MG/ML IV SOLN
INTRAVENOUS | Status: AC
  Filled 2014-06-29: qty 5

## 2014-06-29 SURGICAL SUPPLY — 7 items
ADH SKN CLS APL DERMABOND .7 (GAUZE/BANDAGES/DRESSINGS) ×1
BINDER BREAST MEDIUM (GAUZE/BANDAGES/DRESSINGS) ×1 IMPLANT
CLSR STERI-STRIP ANTIMIC 1/2X4 (GAUZE/BANDAGES/DRESSINGS) ×1 IMPLANT
DERMABOND ADVANCED (GAUZE/BANDAGES/DRESSINGS) ×1
DERMABOND ADVANCED .7 DNX12 (GAUZE/BANDAGES/DRESSINGS) IMPLANT
DRSG PAD ABDOMINAL 8X10 ST (GAUZE/BANDAGES/DRESSINGS) ×1 IMPLANT
TAPE CLOTH SURG 6X10 WHT LF (GAUZE/BANDAGES/DRESSINGS) ×1 IMPLANT

## 2014-06-29 NOTE — Anesthesia Procedure Notes (Signed)
Procedure Name: LMA Insertion Date/Time: 06/29/2014 8:19 AM Performed by: Luciana Axe K Pre-anesthesia Checklist: Patient identified, Emergency Drugs available, Suction available, Patient being monitored and Timeout performed Patient Re-evaluated:Patient Re-evaluated prior to inductionOxygen Delivery Method: Circle system utilized Preoxygenation: Pre-oxygenation with 100% oxygen Intubation Type: IV induction Ventilation: Mask ventilation without difficulty LMA: LMA inserted LMA Size: 4.0 Number of attempts: 1 Airway Equipment and Method: Stylet Placement Confirmation: positive ETCO2,  CO2 detector and breath sounds checked- equal and bilateral Tube secured with: Tape Dental Injury: Teeth and Oropharynx as per pre-operative assessment

## 2014-06-29 NOTE — Anesthesia Postprocedure Evaluation (Addendum)
  Anesthesia Post-op Note  Patient: Julie [name]  Procedure(s) Performed: Procedure(s): REMOVAL OF RIGHT BREAST TISSUE EXPANDERS (Right)  Patient Location: PACU  Anesthesia Type:General  Level of Consciousness: awake and alert   Airway and Oxygen Therapy: Patient Spontanous Breathing  Post-op Pain: mild  Post-op Assessment: Post-op Vital signs reviewed  Post-op Vital Signs: stable  Last Vitals:  Filed Vitals:   06/29/14 0934  BP: 122/59  Pulse: 59  Temp:   Resp: 21    Complications: No apparent anesthesia complications

## 2014-06-29 NOTE — H&P (Signed)
  Subjective:   Patient ID: Julie [name] is a 49 y.o. female.  HPI  11 weeks post op bilateral NSM with TE/ADM reconsruction. Suffered mastectomy flap necrosis on left and 7 weeks post debridement with replacement smaller TE and additional ADM. Suffered seroma and further skin necrosis and underwent additional operative debridement and now 2 weeks explantation left expander.   Returns today following debridement right chest with late onset ulceration, wound over RIGHT. Reports continued drainage post debridement and erythema, no fevers. Plan removal TE Oncotype 9, no benefit chemotherapy. Final path with benign right breast, left breast with two foci IDC 0.8 and 0.9 cm. 0/4 SLN. Genetic testing negative. Patient presented following MMG with left breast 7 mm mass at 11 o clock, posterior third. [place] concordant and biospy demonstrated IDC. Underwent MRI and R breast benign, left with additional oval mass at 11 o clock middle third. Bx of second area IDC ER/PR+ Her 2 -.  Prior 36 C, deisres Full C Mastectomy weight left 382 g Right 373 g  Review of Systems   Objective:   Physical Exam  Right breast serous drainage through prior debridement scar  Left chest: repair intact , contracted and depressed inferior pole- unchanged   CV: normal heart sounds PULM: clear to ausculatation  Assessment:    Threatened exposure right expander  Post explant left expander  Hx left breast cancer  S/p bilat NSM with TE/ADM reconstruction   Plan:    Plan removal of right tissue expander, drain placement.  Irene Limbo, MD [facility] Plastic & Reconstructive Surgery [phone]

## 2014-06-29 NOTE — Transfer of Care (Signed)
Immediate Anesthesia Transfer of Care Note  Patient: Julie [name]  Procedure(s) Performed: Procedure(s): REMOVAL OF RIGHT BREAST TISSUE EXPANDERS (Right)  Patient Location: PACU  Anesthesia Type:General  Level of Consciousness: awake, oriented and patient cooperative  Airway & Oxygen Therapy: Patient Spontanous Breathing and Patient connected to nasal cannula oxygen  Post-op Assessment: Report given to PACU RN and Post -op Vital signs reviewed and stable  Post vital signs: Reviewed  Complications: No apparent anesthesia complications

## 2014-06-29 NOTE — Op Note (Signed)
Operative Note   DATE OF OPERATION: 1.24.2016  LOCATION: [place]  Main OR-outpatient  SURGICAL DIVISION: Plastic Surgery  PREOPERATIVE DIAGNOSES:  1. History breast cancer 2. Acquired absence bilateral breasts 3. Threatened exposure right expander 4. Seroma  POSTOPERATIVE DIAGNOSES:  same  PROCEDURE:  Removal right tissue expander  SURGEON: Irene Limbo MD MBA  ASSISTANT: none  ANESTHESIA:  General.   EBL: minimal  COMPLICATIONS: None immediate.   INDICATIONS FOR PROCEDURE:  The patient, Julie [name], is a 49 y.o. female born on 1966/02/05, is here for removal of right expander. She had undergone immediate reconstruction following nipple sparing amstectomy and course has been complicated by development full thickness wound lower pole mastectomy flap with seroma and exposure of expander.   FINDINGS: Full thickness wound lower pole mastectomy flap with drainage serous fluid.  DESCRIPTION OF PROCEDURE:  The patient's operative site was marked with the patient in the preoperative area. The patient was taken to the operating room. SCDs were placed and IV antibiotics were given. The patient's operative site was prepped and draped in a sterile fashion. A time out was performed and all information was confirmed to be correct.  Incision made through prior inframammary scar and carried through subcutaneous tissue to implant capsule. Expander removed. Examination of cavity with incorporated acellular dermis, no masses, serous fluid noted. Of note, no acellular dermis present surrounding area of full thickness wound of lower pole mastectomy flap. Cavity irrigated with saline and 15 Fr. Drain placed, secured with 2-0 nylon. IMF incision closed with 3-0 vicryl for closure capsule, 4-0 vicryl in dermis and 4-0 nylon for skin closure. Wound debrided sharply with scissors and 4-0 vicryl placed in dermis, 4-0 nylon for skin closure. Applied tissue adhesive and steri strips. Dry dressing and binder  placed  The patient was allowed to wake from anesthesia, extubated and taken to the recovery room in satisfactory condition.   SPECIMENS: none  DRAINS: 15 Fr JP in right breast cavity  Irene Limbo, MD [facility] Plastic & Reconstructive Surgery [phone]

## 2014-06-29 NOTE — Anesthesia Preprocedure Evaluation (Signed)
Anesthesia Evaluation  Patient identified by MRN, date of birth, ID band Patient awake    Reviewed: Allergy & Precautions, NPO status   History of Anesthesia Complications (+) PONV and history of anesthetic complications  Airway Mallampati: I       Dental  (+) Teeth Intact   Pulmonary neg pulmonary ROS,  breath sounds clear to auscultation        Cardiovascular Rhythm:Regular Rate:Normal     Neuro/Psych negative neurological ROS     GI/Hepatic negative GI ROS, Neg liver ROS,   Endo/Other  negative endocrine ROS  Renal/GU negative Renal ROS     Musculoskeletal  (+) Arthritis -, Fibromyalgia -  Abdominal   Peds  Hematology negative hematology ROS (+)   Anesthesia Other Findings   Reproductive/Obstetrics                             Anesthesia Physical Anesthesia Plan  ASA: I  Anesthesia Plan: General   Post-op Pain Management:    Induction: Intravenous  Airway Management Planned: LMA  Additional Equipment:   Intra-op Plan:   Post-operative Plan: Extubation in OR  Informed Consent: I have reviewed the patients History and Physical, chart, labs and discussed the procedure including the risks, benefits and alternatives for the proposed anesthesia with the patient or authorized representative who has indicated his/her understanding and acceptance.   Dental advisory given  Plan Discussed with: CRNA and Surgeon  Anesthesia Plan Comments:         Anesthesia Quick Evaluation

## 2014-06-30 ENCOUNTER — Encounter (HOSPITAL_COMMUNITY): Payer: Self-pay | Admitting: Plastic Surgery

## 2014-08-12 ENCOUNTER — Telehealth: Payer: Self-pay | Admitting: Hematology and Oncology

## 2014-08-12 ENCOUNTER — Ambulatory Visit (HOSPITAL_BASED_OUTPATIENT_CLINIC_OR_DEPARTMENT_OTHER): Payer: BLUE CROSS/BLUE SHIELD | Admitting: Hematology and Oncology

## 2014-08-12 VITALS — BP 109/67 | HR 76 | Temp 97.8°F | Resp 18 | Ht 64.0 in | Wt 120.8 lb

## 2014-08-12 DIAGNOSIS — C50212 Malignant neoplasm of upper-inner quadrant of left female breast: Secondary | ICD-10-CM

## 2014-08-12 NOTE — Telephone Encounter (Signed)
appts made and  avs printed for pt  Julie [name]

## 2014-08-12 NOTE — Telephone Encounter (Signed)
Dr Lindi Adie added a lab appt to 02/2015,made appt,printed a sch/calendar and mailed to pt

## 2014-08-12 NOTE — Assessment & Plan Note (Signed)
Left breast multifocal invasive ductal carcinoma 0.8 cm and 0.9 cm, ER/PR positive HER-2 negative (ratio is 0.97 and 1.23) Ki-67 ranged 14-16% status post bilateral mastectomies 4 SLN negative.Oncotype DX recurrence score 9, 7 percent risk of recurrence; currently on tamoxifen X10 years.  Tamoxifen toxicities:  Breast cancer surveillance: 1. Chest and axillary exams every 6 months 2. No role of imaging because she had bilateral mastectomies  Return to clinic in 6 months for follow-up

## 2014-08-12 NOTE — Progress Notes (Signed)
Patient Care Team: Maisie Fus, MD as PCP - General (Obstetrics and Gynecology)  DIAGNOSIS: Breast cancer of upper-inner quadrant of left female breast   Staging form: Breast, AJCC 7th Edition     Pathologic stage from 04/14/2014: Stage IA (T1b, N0, cM0) - Signed by Rulon Eisenmenger, MD on 05/15/2014       Staging comments: Staged from final excisional specimen report by Dr. Tresa Moore      Clinical: Stage IA (T1b(2), N0, M0) - Unsigned   SUMMARY OF ONCOLOGIC HISTORY:   Breast cancer of upper-inner quadrant of left female breast   02/25/2014 Initial Biopsy Left breast mass 11:00: Invasive ductal carcinoma grade 1, ER 100%, PR 99%, Ki-67 14%, HER-2 negative ratio 1.5   03/05/2014 Breast MRI Left breast 11:00, 10 x 7 x 7 mm, in addition 7 x 5 x 6 mm masslike area, negative lymph nodes   04/11/2014 Surgery Bilateral mastectomies: Left breast: Multifocal IDC 0.8 cm and 0.9 cm with DCIS 4 SLN negative, bilateral nipple biopsy is negative: Right breast mastectomy no malignancy 2 lymph nodes negative: ER/PR positive HER-2 negative Ki-67 14-16%    Procedure Genetic testing did not reveal any mutations   05/27/2014 -  Anti-estrogen oral therapy Tamoxifen 20 mg daily 10 years    CHIEF COMPLIANT: Follow-up on tamoxifen  INTERVAL HISTORY: Julie [name] is a 49 year old lady with above-mentioned history of left breast cancer treated with bilateral mastectomies. She was attempting to breast reconstruction but because of infections, the expanders had to be remote. She does not want to do anything further for the rest of the spring and summer. She has been on tamoxifen and has been tolerating it extremely well without any major problems or concerns. She had her periods stopped for a couple of months but doubt they have resumed.  REVIEW OF SYSTEMS:   Constitutional: Denies fevers, chills or abnormal weight loss Eyes: Denies blurriness of vision Ears, nose, mouth, throat, and face: Denies mucositis or sore  throat Respiratory: Denies cough, dyspnea or wheezes Cardiovascular: Denies palpitation, chest discomfort or lower extremity swelling Gastrointestinal:  Denies nausea, heartburn or change in bowel habits Skin: Denies abnormal skin rashes Lymphatics: Denies new lymphadenopathy or easy bruising Neurological:Denies numbness, tingling or new weaknesses Behavioral/Psych: Mood is stable, no new changes  Breast:  denies any pain or lumps or nodules in either breasts All other systems were reviewed with the patient and are negative.  I have reviewed the past medical history, past surgical history, social history and family history with the patient and they are unchanged from previous note.  ALLERGIES:  is allergic to codeine and hydrocodone.  MEDICATIONS:  Current Outpatient Prescriptions  Medication Sig Dispense Refill  . FLUoxetine (PROZAC) 20 MG capsule Take 20 mg by mouth daily.    . tamoxifen (NOLVADEX) 20 MG tablet Take 1 tablet (20 mg total) by mouth daily. (Patient taking differently: Take 20 mg by mouth at bedtime. ) 90 tablet 3  . calcium carbonate (TUMS - DOSED IN MG ELEMENTAL CALCIUM) 500 MG chewable tablet Chew 1 tablet by mouth daily as needed for indigestion or heartburn.    . diazepam (VALIUM) 5 MG tablet Take 1 tablet (5 mg total) by mouth every 8 (eight) hours as needed for anxiety or muscle spasms. (Patient not taking: Reported on 08/12/2014) 30 tablet 0  . docusate sodium (COLACE) 100 MG capsule Take 100 mg by mouth 2 (two) times daily.    . fluconazole (DIFLUCAN) 150 MG tablet Take 1  tablet (150 mg total) by mouth daily. (Patient not taking: Reported on 08/12/2014) 2 tablet 0  . loratadine (CLARITIN) 10 MG tablet Take 10 mg by mouth daily as needed for allergies.    Marland Kitchen omeprazole (PRILOSEC) 20 MG capsule Take 20 mg by mouth daily.  3  . promethazine (PHENERGAN) 25 MG tablet Take 25 mg by mouth every 6 (six) hours as needed for nausea or vomiting.     No current  facility-administered medications for this visit.    PHYSICAL EXAMINATION: ECOG PERFORMANCE STATUS: 0 - Asymptomatic  Filed Vitals:   08/12/14 1505  BP: 109/67  Pulse: 76  Temp: 97.8 F (36.6 C)  Resp: 18   Filed Weights   08/12/14 1505  Weight: 120 lb 12.8 oz (54.795 kg)    GENERAL:alert, no distress and comfortable SKIN: skin color, texture, turgor are normal, no rashes or significant lesions EYES: normal, Conjunctiva are pink and non-injected, sclera clear OROPHARYNX:no exudate, no erythema and lips, buccal mucosa, and tongue normal  NECK: supple, thyroid normal size, non-tender, without nodularity LYMPH:  no palpable lymphadenopathy in the cervical, axillary or inguinal LUNGS: clear to auscultation and percussion with normal breathing effort HEART: regular rate & rhythm and no murmurs and no lower extremity edema ABDOMEN:abdomen soft, non-tender and normal bowel sounds Musculoskeletal:no cyanosis of digits and no clubbing  NEURO: alert & oriented x 3 with fluent speech, no focal motor/sensory deficits  LABORATORY DATA:  I have reviewed the data as listed   Chemistry      Component Value Date/Time   NA 140 05/07/2014 1238   K 4.2 05/07/2014 1238   CL 106 05/07/2014 1238   CO2 23 05/07/2014 1238   BUN 8 05/07/2014 1238   CREATININE 0.85 05/07/2014 1238      Component Value Date/Time   CALCIUM 8.5 05/07/2014 1238       Lab Results  Component Value Date   WBC 4.2 05/07/2014   HGB 11.6* 06/04/2014   HCT 34.5* 05/07/2014   MCV 82.9 05/07/2014   PLT 218 05/07/2014   ASSESSMENT & PLAN:  Breast cancer of upper-inner quadrant of left female breast Left breast multifocal invasive ductal carcinoma 0.8 cm and 0.9 cm, ER/PR positive HER-2 negative (ratio is 0.97 and 1.23) Ki-67 ranged 14-16% status post bilateral mastectomies 4 SLN negative.Oncotype DX recurrence score 9, 7 percent risk of recurrence; currently on tamoxifen X10 years. Due to infections, the breast  expanders had to be removed. She is not planning a reconstruction onto the fall.  Tamoxifen toxicities: Patient denies any hot flashes or muscle aches Her periods have resumed and she sees a gynecologist once a year and that is coming up. I discussed with her about switching Prozac to Effexor. She will discuss with her primary care physician.  Breast cancer surveillance: 1. Chest and axillary exams every 6 months 2. No role of imaging because she had bilateral mastectomies Blood work once a year  Return to clinic in 6 months for follow-up   Orders Placed This Encounter  Procedures  . CBC with Differential    Standing Status: Future     Number of Occurrences:      Standing Expiration Date: 08/12/2015  . Comprehensive metabolic panel (Cmet) - [facility]    Standing Status: Future     Number of Occurrences:      Standing Expiration Date: 08/12/2015   The patient has a good understanding of the overall plan. she agrees with it. She will call with any problems  that may develop before her next visit here.   Rulon Eisenmenger, MD

## 2014-10-16 ENCOUNTER — Other Ambulatory Visit: Payer: Self-pay

## 2014-10-16 DIAGNOSIS — Z853 Personal history of malignant neoplasm of breast: Secondary | ICD-10-CM

## 2014-10-16 NOTE — Addendum Note (Signed)
Addended by: Luella Cook III on: 10/16/2014 05:06 PM   Modules accepted: Orders

## 2014-10-21 ENCOUNTER — Ambulatory Visit
Admission: RE | Admit: 2014-10-21 | Discharge: 2014-10-21 | Disposition: A | Discharge status: Home / Self Care | Payer: BLUE CROSS/BLUE SHIELD | Source: Ambulatory Visit | Attending: General Surgery | Admitting: General Surgery

## 2015-02-12 ENCOUNTER — Other Ambulatory Visit (HOSPITAL_BASED_OUTPATIENT_CLINIC_OR_DEPARTMENT_OTHER): Payer: BLUE CROSS/BLUE SHIELD

## 2015-02-12 ENCOUNTER — Telehealth: Payer: Self-pay | Admitting: Hematology and Oncology

## 2015-02-12 ENCOUNTER — Ambulatory Visit (HOSPITAL_BASED_OUTPATIENT_CLINIC_OR_DEPARTMENT_OTHER): Payer: BLUE CROSS/BLUE SHIELD | Admitting: Hematology and Oncology

## 2015-02-12 ENCOUNTER — Encounter: Payer: Self-pay | Admitting: Hematology and Oncology

## 2015-02-12 VITALS — BP 118/71 | HR 62 | Temp 99.2°F | Resp 18 | Ht 64.0 in | Wt 126.8 lb

## 2015-02-12 DIAGNOSIS — C50212 Malignant neoplasm of upper-inner quadrant of left female breast: Secondary | ICD-10-CM | POA: Diagnosis not present

## 2015-02-12 LAB — COMPREHENSIVE METABOLIC PANEL (CC13)
ALK PHOS: 61 U/L (ref 40–150)
ALT: 14 U/L (ref 0–55)
AST: 19 U/L (ref 5–34)
Albumin: 3.7 g/dL (ref 3.5–5.0)
Anion Gap: 8 mEq/L (ref 3–11)
BUN: 8 mg/dL (ref 7.0–26.0)
CHLORIDE: 107 meq/L (ref 98–109)
CO2: 24 mEq/L (ref 22–29)
CREATININE: 0.8 mg/dL (ref 0.6–1.1)
Calcium: 9 mg/dL (ref 8.4–10.4)
EGFR: 84 mL/min/{1.73_m2} — ABNORMAL LOW (ref >90)
Glucose: 98 mg/dl (ref 70–140)
Potassium: 3.8 mEq/L (ref 3.5–5.1)
Sodium: 139 mEq/L (ref 136–145)
Total Bilirubin: 0.63 mg/dL (ref 0.20–1.20)
Total Protein: 6.8 g/dL (ref 6.4–8.3)

## 2015-02-12 LAB — CBC WITH DIFFERENTIAL/PLATELET
BASO%: 0.7 % (ref 0.0–2.0)
Basophils Absolute: 0 10*3/uL (ref 0.0–0.1)
EOS%: 0.8 % (ref 0.0–7.0)
Eosinophils Absolute: 0 10*3/uL (ref 0.0–0.5)
HCT: 38.5 % (ref 34.8–46.6)
HEMOGLOBIN: 12.4 g/dL (ref 11.6–15.9)
LYMPH#: 1.8 10*3/uL (ref 0.9–3.3)
LYMPH%: 37.2 % (ref 14.0–49.7)
MCH: 26.1 pg (ref 25.1–34.0)
MCHC: 32.1 g/dL (ref 31.5–36.0)
MCV: 81.3 fL (ref 79.5–101.0)
MONO#: 0.3 10*3/uL (ref 0.1–0.9)
MONO%: 6.9 % (ref 0.0–14.0)
NEUT%: 54.4 % (ref 38.4–76.8)
NEUTROS ABS: 2.6 10*3/uL (ref 1.5–6.5)
Platelets: 191 10*3/uL (ref 145–400)
RBC: 4.74 10*6/uL (ref 3.70–5.45)
RDW: 16.9 % — AB (ref 11.2–14.5)
WBC: 4.7 10*3/uL (ref 3.9–10.3)

## 2015-02-12 MED ORDER — TAMOXIFEN CITRATE 20 MG PO TABS: 20.0000 mg | ORAL_TABLET | Freq: Every day | ORAL | Status: DC

## 2015-02-12 NOTE — Assessment & Plan Note (Signed)
Left breast multifocal invasive ductal carcinoma 0.8 cm and 0.9 cm, ER/PR positive HER-2 negative (ratio is 0.97 and 1.23) Ki-67 ranged 14-16% status post bilateral mastectomies 4 SLN negative.Oncotype DX recurrence score 9, 7 percent risk of recurrence; currently on tamoxifen X10 years. Due to infections, the breast expanders had to be removed. She is not planning a reconstruction until fall.  Tamoxifen toxicities: Patient denies any hot flashes or muscle aches Her periods have resumed and she sees a gynecologist once a year and that is coming up. I discussed with her about switching Prozac to Effexor. She will discuss with her primary care physician.  Breast cancer surveillance: 1. Chest and axillary exams every 6 months 2. No role of imaging because she had bilateral mastectomies  Return to clinic in 6 months for follow-up

## 2015-02-12 NOTE — Progress Notes (Signed)
Patient Care Team: Maisie Fus, MD as PCP - General (Obstetrics and Gynecology)  DIAGNOSIS: Breast cancer of upper-inner quadrant of left female breast   Staging form: Breast, AJCC 7th Edition     Pathologic stage from 04/14/2014: Stage IA (T1b, N0, cM0) - Signed by Rulon Eisenmenger, MD on 05/15/2014       Staging comments: Staged from final excisional specimen report by Dr. Tresa Moore      Clinical: Stage IA (T1b(2), N0, M0) - Unsigned   SUMMARY OF ONCOLOGIC HISTORY:   Breast cancer of upper-inner quadrant of left female breast   02/25/2014 Initial Biopsy Left breast mass 11:00: Invasive ductal carcinoma grade 1, ER 100%, PR 99%, Ki-67 14%, HER-2 negative ratio 1.5   03/05/2014 Breast MRI Left breast 11:00, 10 x 7 x 7 mm, in addition 7 x 5 x 6 mm masslike area, negative lymph nodes   04/11/2014 Surgery Bilateral mastectomies: Left breast: Multifocal IDC 0.8 cm and 0.9 cm with DCIS 4 SLN negative, bilateral nipple biopsy is negative: Right breast mastectomy no malignancy 2 lymph nodes negative: ER/PR positive HER-2 negative Ki-67 14-16%    Procedure Genetic testing did not reveal any mutations   05/27/2014 -  Anti-estrogen oral therapy Tamoxifen 20 mg daily 10 years    CHIEF COMPLIANT: Follow-up on tamoxifen  INTERVAL HISTORY: Julie [name] is a 49 year old with above-mentioned history of left-sided breast cancer treated with bilateral mastectomies and reconstruction currently on tamoxifen therapy since December 2015. She is tolerating it fairly well without any major problems. She has a pool built in her house and has been outdoors a lot and is extremely tanned.  REVIEW OF SYSTEMS:   Constitutional: Denies fevers, chills or abnormal weight loss Eyes: Denies blurriness of vision Ears, nose, mouth, throat, and face: Denies mucositis or sore throat Respiratory: Denies cough, dyspnea or wheezes Cardiovascular: Denies palpitation, chest discomfort or lower extremity swelling Gastrointestinal:   Denies nausea, heartburn or change in bowel habits Skin: Denies abnormal skin rashes Lymphatics: Denies new lymphadenopathy or easy bruising Neurological:Denies numbness, tingling or new weaknesses Behavioral/Psych: Mood is stable, no new changes  Breast: Occasional discomfort in the reconstructed breasts All other systems were reviewed with the patient and are negative.  I have reviewed the past medical history, past surgical history, social history and family history with the patient and they are unchanged from previous note.  ALLERGIES:  is allergic to codeine and hydrocodone.  MEDICATIONS:  Current Outpatient Prescriptions  Medication Sig Dispense Refill  . calcium carbonate (TUMS - DOSED IN MG ELEMENTAL CALCIUM) 500 MG chewable tablet Chew 1 tablet by mouth daily as needed for indigestion or heartburn.    . diazepam (VALIUM) 5 MG tablet Take 1 tablet (5 mg total) by mouth every 8 (eight) hours as needed for anxiety or muscle spasms. 30 tablet 0  . docusate sodium (COLACE) 100 MG capsule Take 100 mg by mouth 2 (two) times daily.    . fluconazole (DIFLUCAN) 150 MG tablet Take 1 tablet (150 mg total) by mouth daily. 2 tablet 0  . FLUoxetine (PROZAC) 20 MG capsule Take 20 mg by mouth daily.    Marland Kitchen loratadine (CLARITIN) 10 MG tablet Take 10 mg by mouth daily as needed for allergies.    Marland Kitchen omeprazole (PRILOSEC) 20 MG capsule Take 20 mg by mouth daily.  3  . promethazine (PHENERGAN) 25 MG tablet Take 25 mg by mouth every 6 (six) hours as needed for nausea or vomiting.    Marland Kitchen  tamoxifen (NOLVADEX) 20 MG tablet Take 1 tablet (20 mg total) by mouth daily. (Patient taking differently: Take 20 mg by mouth at bedtime. ) 90 tablet 3   No current facility-administered medications for this visit.    PHYSICAL EXAMINATION: ECOG PERFORMANCE STATUS: 1 - Symptomatic but completely ambulatory  Filed Vitals:   02/12/15 1429  BP: 118/71  Pulse: 62  Temp: 99.2 F (37.3 C)  Resp: 18   Filed Weights    02/12/15 1429  Weight: 126 lb 12.8 oz (57.516 kg)    GENERAL:alert, no distress and comfortable SKIN: skin color, texture, turgor are normal, no rashes or significant lesions EYES: normal, Conjunctiva are pink and non-injected, sclera clear OROPHARYNX:no exudate, no erythema and lips, buccal mucosa, and tongue normal  NECK: supple, thyroid normal size, non-tender, without nodularity LYMPH:  no palpable lymphadenopathy in the cervical, axillary or inguinal LUNGS: clear to auscultation and percussion with normal breathing effort HEART: regular rate & rhythm and no murmurs and no lower extremity edema ABDOMEN:abdomen soft, non-tender and normal bowel sounds Musculoskeletal:no cyanosis of digits and no clubbing  NEURO: alert & oriented x 3 with fluent speech, no focal motor/sensory deficits BREAST: No palpable lumps or nodules in the reconstructed breast or chest wall or axilla. (exam performed in the presence of a chaperone)  LABORATORY DATA:  I have reviewed the data as listed   Chemistry      Component Value Date/Time   NA 140 05/07/2014 1238   K 4.2 05/07/2014 1238   CL 106 05/07/2014 1238   CO2 23 05/07/2014 1238   BUN 8 05/07/2014 1238   CREATININE 0.85 05/07/2014 1238      Component Value Date/Time   CALCIUM 8.5 05/07/2014 1238       Lab Results  Component Value Date   WBC 4.7 02/12/2015   HGB 12.4 02/12/2015   HCT 38.5 02/12/2015   MCV 81.3 02/12/2015   PLT 191 02/12/2015   NEUTROABS 2.6 02/12/2015   My ASSESSMENT & PLAN:  Breast cancer of upper-inner quadrant of left female breast Left breast multifocal invasive ductal carcinoma 0.8 cm and 0.9 cm, ER/PR positive HER-2 negative (ratio is 0.97 and 1.23) Ki-67 ranged 14-16% status post bilateral mastectomies 4 SLN negative.Oncotype DX recurrence score 9, 7 percent risk of recurrence; currently on tamoxifen X10 years. Due to infections, the breast expanders had to be removed. She is not planning a reconstruction until  fall. Patient plans to begin breast reconstruction prosthesis again in January 2017.  Tamoxifen toxicities: Patient denies any hot flashes or muscle aches Her periods have resumed and she sees a gynecologist once a year and that is coming up. I discussed with her about switching Prozac to Effexor. She plans to stay on Prozac because it has worked very well for her or long period of time.  Breast cancer surveillance: 1. Chest and axillary exams every 6 months 2. No role of imaging because she had bilateral mastectomies  Return to clinic in 6 months for follow-up   No orders of the defined types were placed in this encounter.   The patient has a good understanding of the overall plan. she agrees with it. she will call with any problems that may develop before the next visit here.   Rulon Eisenmenger, MD

## 2015-02-12 NOTE — Telephone Encounter (Signed)
Gave avs & calendar for March. °

## 2015-03-22 ENCOUNTER — Telehealth: Payer: Self-pay | Admitting: Hematology and Oncology

## 2015-03-22 NOTE — Telephone Encounter (Signed)
lvm for pt regarding to 3.9 appt moved to 3.7 due to md on pal....mailed pt appt sched adn letter

## 2015-05-28 ENCOUNTER — Other Ambulatory Visit (HOSPITAL_COMMUNITY): Payer: BLUE CROSS/BLUE SHIELD

## 2015-06-05 ENCOUNTER — Ambulatory Visit (HOSPITAL_COMMUNITY)
Admission: RE | Admit: 2015-06-05 | Payer: BLUE CROSS/BLUE SHIELD | Source: Ambulatory Visit | Admitting: Plastic Surgery

## 2015-06-05 ENCOUNTER — Encounter (HOSPITAL_COMMUNITY): Admission: RE | Payer: Self-pay | Source: Ambulatory Visit

## 2015-06-05 SURGERY — BREAST RECONSTRUCTION WITH PLACEMENT OF TISSUE EXPANDER AND FLEX HD (ACELLULAR HYDRATED DERMIS)
Anesthesia: General | Site: Breast | Laterality: Left

## 2015-06-09 NOTE — H&P (Addendum)
  Subjective:    Patient ID: Julie [name] is a 50 y.o. female.  Follow-up  13 months post diagnosis breast cancer left. S/p bilateral mastectomies with immediate reconstruction with TE. Course complicated by mastectomy flap necrosis and eventual removal bilateral expanders.   Patient here for follow up discussion reconstruction, accompanied by husband.  Mastectomy weight left 382 g Right 373 g  Review of Systems     Objective:   Physical Exam  Cardiovascular: Normal rate, regular rhythm and normal heart sounds.  Pulmonary/Chest: Effort normal.  Abdominal: Soft.     lower pole skin flaps contracted bilateral, able to pinch soft tissue from chest wall bilateral, softer SN to nipple 19 L 20 cm nipple to IMF scar R 6 L 5 cm BW R 12 L 12 Assessment:     Acquired absence bilateral breasts , history breast cancer Post explant bilateral TE    Plan:     We have discussed in past that she has lost skin envelope on left with regards to width and nipple to IMF distance and flap would be able to bring in new skin to replace this. Although last visit I discussed replacement expanders, counseled today that her soft tissue flaps are still quite thin, and flap surgery would help with this. Discussed alternative repeat fat grafting to increase soft tissue but this would be followed by expanders. My preference and recommendation returns to latissimus flaps bilateral. In this situation, plan placement implants. Reviewed risks infection, failure flap, asymmetry, damage to deeper structures, diminished strength arms. Reviewed 1-2 day hospital stay, 4 drains. Patient agrees to bilateral latissimus flaps. Plan placement silicone implants. Reviewed risks implants including rupture, contracture, infection requiring removal.    Irene Limbo, MD [facility] Plastic & Reconstructive Surgery [phone]

## 2015-06-12 ENCOUNTER — Encounter (HOSPITAL_COMMUNITY)
Admission: RE | Admit: 2015-06-12 | Discharge: 2015-06-12 | Disposition: A | Discharge status: Home / Self Care | Payer: BLUE CROSS/BLUE SHIELD | Source: Ambulatory Visit | Attending: Plastic Surgery | Admitting: Plastic Surgery

## 2015-06-12 ENCOUNTER — Encounter (HOSPITAL_COMMUNITY): Payer: Self-pay

## 2015-06-12 DIAGNOSIS — Z9013 Acquired absence of bilateral breasts and nipples: Secondary | ICD-10-CM | POA: Insufficient documentation

## 2015-06-12 DIAGNOSIS — Z853 Personal history of malignant neoplasm of breast: Secondary | ICD-10-CM | POA: Insufficient documentation

## 2015-06-12 DIAGNOSIS — Z01812 Encounter for preprocedural laboratory examination: Secondary | ICD-10-CM | POA: Diagnosis not present

## 2015-06-12 HISTORY — DX: Gastro-esophageal reflux disease without esophagitis: K21.9

## 2015-06-12 HISTORY — DX: Anxiety disorder, unspecified: F41.9

## 2015-06-12 LAB — CBC WITH DIFFERENTIAL/PLATELET
BASOS ABS: 0 10*3/uL (ref 0.0–0.1)
BASOS PCT: 0 %
EOS PCT: 1 %
Eosinophils Absolute: 0.1 10*3/uL (ref 0.0–0.7)
HEMATOCRIT: 37.7 % (ref 36.0–46.0)
Hemoglobin: 11.9 g/dL — ABNORMAL LOW (ref 12.0–15.0)
LYMPHS PCT: 33 %
Lymphs Abs: 2.2 10*3/uL (ref 0.7–4.0)
MCH: 26.8 pg (ref 26.0–34.0)
MCHC: 31.6 g/dL (ref 30.0–36.0)
MCV: 84.9 fL (ref 78.0–100.0)
MONO ABS: 0.6 10*3/uL (ref 0.1–1.0)
Monocytes Relative: 9 %
NEUTROS ABS: 3.7 10*3/uL (ref 1.7–7.7)
Neutrophils Relative %: 57 %
PLATELETS: 184 10*3/uL (ref 150–400)
RBC: 4.44 MIL/uL (ref 3.87–5.11)
RDW: 14.3 % (ref 11.5–15.5)
WBC: 6.5 10*3/uL (ref 4.0–10.5)

## 2015-06-12 LAB — BASIC METABOLIC PANEL
Anion gap: 8 (ref 5–15)
BUN: 8 mg/dL (ref 6–20)
CALCIUM: 8.6 mg/dL — AB (ref 8.9–10.3)
CO2: 24 mmol/L (ref 22–32)
Chloride: 106 mmol/L (ref 101–111)
Creatinine, Ser: 0.71 mg/dL (ref 0.44–1.00)
GFR calc Af Amer: >60 mL/min (ref >60)
GLUCOSE: 75 mg/dL (ref 65–99)
Potassium: 3.9 mmol/L (ref 3.5–5.1)
Sodium: 138 mmol/L (ref 135–145)

## 2015-06-12 LAB — HCG, SERUM, QUALITATIVE: PREG SERUM: NEGATIVE

## 2015-06-12 NOTE — Pre-Procedure Instructions (Addendum)
Julie [name]  06/12/2015      [id number] - [place], [place] [address] [place] [address] Phone: [phone] Fax: [phone]    Your procedure is scheduled on 06/17/15  Report to [facility] Admitting at 1045 A.M.  Call this number if you have problems the morning of surgery:  [phone]   Remember:  Do not eat food or drink liquids after midnight.  Take these medicines the morning of surgery with A SIP OF WATER prozac,prilosec, valium if needed, tamoxifen   STOP all herbel meds, nsaids (aleve,naproxen,advil,ibuprofen) starting TODAY including vitamins, aspirin   Do not wear jewelry, make-up or nail polish.  Do not wear lotions, powders, or perfumes.  You may wear deodorant.  Do not shave 48 hours prior to surgery.  Men may shave face and neck.  Do not bring valuables to the hospital.  [facility] is not responsible for any belongings or valuables.  Contacts, dentures or bridgework may not be worn into surgery.  Leave your suitcase in the car.  After surgery it may be brought to your room.  For patients admitted to the hospital, discharge time will be determined by your treatment team.  Patients discharged the day of surgery will not be allowed to drive home.   Name and phone number of your driver:    Special instructions:   Special Instructions: [place] - Preparing for Surgery  Before surgery, you can play an important role.  Because skin is not sterile, your skin needs to be as free of germs as possible.  You can reduce the number of germs on you skin by washing with CHG (chlorahexidine gluconate) soap before surgery.  CHG is an antiseptic cleaner which kills germs and bonds with the skin to continue killing germs even after washing.  Please DO NOT use if you have an allergy to CHG or antibacterial soaps.  If your skin becomes reddened/irritated stop using the CHG and inform your nurse when you  arrive at Short Stay.  Do not shave (including legs and underarms) for at least 48 hours prior to the first CHG shower.  You may shave your face.  Please follow these instructions carefully:   1.  Shower with CHG Soap the night before surgery and the morning of Surgery.  2.  If you choose to wash your hair, wash your hair first as usual with your normal shampoo.  3.  After you shampoo, rinse your hair and body thoroughly to remove the Shampoo.  4.  Use CHG as you would any other liquid soap.  You can apply chg directly  to the skin and wash gently with scrungie or a clean washcloth.  5.  Apply the CHG Soap to your body ONLY FROM THE NECK DOWN.  Do not use on open wounds or open sores.  Avoid contact with your eyes ears, mouth and genitals (private parts).  Wash genitals (private parts)       with your normal soap.  6.  Wash thoroughly, paying special attention to the area where your surgery will be performed.  7.  Thoroughly rinse your body with warm water from the neck down.  8.  DO NOT shower/wash with your normal soap after using and rinsing off the CHG Soap.  9.  Pat yourself dry with a clean towel.            10.  Wear clean  pajamas.            11.  Place clean sheets on your bed the night of your first shower and do not sleep with pets.  Day of Surgery  Do not apply any lotions/deodorants the morning of surgery.  Please wear clean clothes to the hospital/surgery center.  Please read over the following fact sheets that you were given. Pain Booklet, Coughing and Deep Breathing and Surgical Site Infection Prevention

## 2015-06-16 MED ORDER — HEPARIN SODIUM (PORCINE) 5000 UNIT/ML IJ SOLN
5000.0000 [IU] | Freq: Once | INTRAMUSCULAR | Status: AC
  Administered 2015-06-17: 5000 [IU] via SUBCUTANEOUS
  Filled 2015-06-16: qty 1

## 2015-06-16 MED ORDER — CHLORHEXIDINE GLUCONATE 4 % EX LIQD: 1.0000 "application " | Freq: Once | CUTANEOUS | Status: DC

## 2015-06-16 MED ORDER — CEFAZOLIN SODIUM-DEXTROSE 2-3 GM-% IV SOLR
2.0000 g | INTRAVENOUS | Status: AC
  Administered 2015-06-17 (×2): 2 g via INTRAVENOUS
  Filled 2015-06-16: qty 50

## 2015-06-17 ENCOUNTER — Encounter (HOSPITAL_COMMUNITY)
Admission: AD | Disposition: A | Discharge status: Home / Self Care | Payer: Self-pay | Source: Ambulatory Visit | Attending: Plastic Surgery

## 2015-06-17 ENCOUNTER — Ambulatory Visit (HOSPITAL_COMMUNITY): Payer: BLUE CROSS/BLUE SHIELD | Admitting: Anesthesiology

## 2015-06-17 ENCOUNTER — Encounter (HOSPITAL_COMMUNITY): Payer: Self-pay | Admitting: *Deleted

## 2015-06-17 ENCOUNTER — Inpatient Hospital Stay (HOSPITAL_COMMUNITY)
Admission: AD | Admit: 2015-06-17 | Discharge: 2015-06-20 | DRG: 940 | Disposition: A | Discharge status: Home / Self Care | Payer: BLUE CROSS/BLUE SHIELD | Source: Ambulatory Visit | Attending: Plastic Surgery | Admitting: Plastic Surgery

## 2015-06-17 DIAGNOSIS — Z853 Personal history of malignant neoplasm of breast: Secondary | ICD-10-CM | POA: Diagnosis present

## 2015-06-17 DIAGNOSIS — L512 Toxic epidermal necrolysis [Lyell]: Secondary | ICD-10-CM | POA: Diagnosis not present

## 2015-06-17 DIAGNOSIS — R234 Changes in skin texture: Secondary | ICD-10-CM | POA: Diagnosis not present

## 2015-06-17 DIAGNOSIS — Z9013 Acquired absence of bilateral breasts and nipples: Secondary | ICD-10-CM | POA: Diagnosis not present

## 2015-06-17 HISTORY — PX: LATISSIMUS FLAP TO BREAST: SHX5357

## 2015-06-17 SURGERY — RECONSTRUCTION, BREAST, USING LATISSIMUS DORSI MYOCUTANEOUS FLAP
Anesthesia: General | Site: Chest | Laterality: Bilateral

## 2015-06-17 MED ORDER — CEFAZOLIN SODIUM 1-5 GM-% IV SOLN
1.0000 g | Freq: Three times a day (TID) | INTRAVENOUS | Status: DC
  Administered 2015-06-18 – 2015-06-20 (×8): 1 g via INTRAVENOUS
  Filled 2015-06-17 (×12): qty 50

## 2015-06-17 MED ORDER — PANTOPRAZOLE SODIUM 40 MG PO TBEC
40.0000 mg | DELAYED_RELEASE_TABLET | Freq: Every day | ORAL | Status: DC
  Administered 2015-06-18 – 2015-06-20 (×3): 40 mg via ORAL
  Filled 2015-06-17 (×3): qty 1

## 2015-06-17 MED ORDER — FENTANYL CITRATE (PF) 100 MCG/2ML IJ SOLN
INTRAMUSCULAR | Status: DC | PRN
  Administered 2015-06-17 (×3): 100 ug via INTRAVENOUS
  Administered 2015-06-17: 50 ug via INTRAVENOUS
  Administered 2015-06-17: 100 ug via INTRAVENOUS
  Administered 2015-06-17: 50 ug via INTRAVENOUS

## 2015-06-17 MED ORDER — KCL IN DEXTROSE-NACL 20-5-0.45 MEQ/L-%-% IV SOLN
INTRAVENOUS | Status: AC
  Filled 2015-06-17: qty 1000

## 2015-06-17 MED ORDER — PROPOFOL 10 MG/ML IV BOLUS
INTRAVENOUS | Status: DC | PRN
  Administered 2015-06-17: 120 mg via INTRAVENOUS

## 2015-06-17 MED ORDER — KCL IN DEXTROSE-NACL 20-5-0.45 MEQ/L-%-% IV SOLN
INTRAVENOUS | Status: DC
  Administered 2015-06-18 (×2): via INTRAVENOUS
  Filled 2015-06-17 (×4): qty 1000

## 2015-06-17 MED ORDER — BUPIVACAINE LIPOSOME 1.3 % IJ SUSP
20.0000 mL | Freq: Once | INTRAMUSCULAR | Status: DC
  Filled 2015-06-17: qty 20

## 2015-06-17 MED ORDER — SUCCINYLCHOLINE CHLORIDE 20 MG/ML IJ SOLN
INTRAMUSCULAR | Status: AC
  Filled 2015-06-17: qty 2

## 2015-06-17 MED ORDER — VECURONIUM BROMIDE 10 MG IV SOLR
INTRAVENOUS | Status: DC | PRN
  Administered 2015-06-17: 3 mg via INTRAVENOUS

## 2015-06-17 MED ORDER — ROCURONIUM BROMIDE 100 MG/10ML IV SOLN
INTRAVENOUS | Status: DC | PRN
  Administered 2015-06-17 (×3): 10 mg via INTRAVENOUS
  Administered 2015-06-17 (×2): 20 mg via INTRAVENOUS
  Administered 2015-06-17 (×3): 10 mg via INTRAVENOUS
  Administered 2015-06-17: 50 mg via INTRAVENOUS

## 2015-06-17 MED ORDER — SUGAMMADEX SODIUM 200 MG/2ML IV SOLN
INTRAVENOUS | Status: AC
  Filled 2015-06-17: qty 2

## 2015-06-17 MED ORDER — FLUOXETINE HCL 20 MG PO CAPS
20.0000 mg | ORAL_CAPSULE | Freq: Every day | ORAL | Status: DC
  Administered 2015-06-18 – 2015-06-20 (×3): 20 mg via ORAL
  Filled 2015-06-17 (×3): qty 1

## 2015-06-17 MED ORDER — HYDROMORPHONE HCL 1 MG/ML IJ SOLN
0.5000 mg | INTRAMUSCULAR | Status: DC | PRN
  Administered 2015-06-18 – 2015-06-19 (×5): 1 mg via INTRAVENOUS
  Filled 2015-06-17 (×5): qty 1

## 2015-06-17 MED ORDER — TAMOXIFEN CITRATE 10 MG PO TABS
20.0000 mg | ORAL_TABLET | Freq: Every day | ORAL | Status: DC
  Administered 2015-06-18 – 2015-06-20 (×3): 20 mg via ORAL
  Filled 2015-06-17 (×3): qty 2

## 2015-06-17 MED ORDER — SCOPOLAMINE 1 MG/3DAYS TD PT72
1.0000 | MEDICATED_PATCH | Freq: Once | TRANSDERMAL | Status: DC
  Administered 2015-06-17: 1.5 mg via TRANSDERMAL
  Filled 2015-06-17: qty 1

## 2015-06-17 MED ORDER — BUPIVACAINE LIPOSOME 1.3 % IJ SUSP
INTRAMUSCULAR | Status: DC | PRN
  Administered 2015-06-17: 20 mL

## 2015-06-17 MED ORDER — LIDOCAINE HCL (CARDIAC) 20 MG/ML IV SOLN
INTRAVENOUS | Status: DC | PRN
  Administered 2015-06-17: 50 mg via INTRAVENOUS

## 2015-06-17 MED ORDER — FENTANYL CITRATE (PF) 250 MCG/5ML IJ SOLN
INTRAMUSCULAR | Status: AC
  Filled 2015-06-17: qty 5

## 2015-06-17 MED ORDER — DIAZEPAM 5 MG PO TABS
5.0000 mg | ORAL_TABLET | Freq: Three times a day (TID) | ORAL | Status: DC | PRN
  Administered 2015-06-17: 5 mg via ORAL

## 2015-06-17 MED ORDER — SODIUM CHLORIDE 0.9 % IV SOLN
Freq: Once | INTRAVENOUS | Status: DC
  Filled 2015-06-17: qty 1

## 2015-06-17 MED ORDER — ENOXAPARIN SODIUM 40 MG/0.4ML ~~LOC~~ SOLN
40.0000 mg | Freq: Every day | SUBCUTANEOUS | Status: DC
  Administered 2015-06-18 – 2015-06-20 (×3): 40 mg via SUBCUTANEOUS
  Filled 2015-06-17 (×3): qty 0.4

## 2015-06-17 MED ORDER — PROPOFOL 10 MG/ML IV BOLUS
INTRAVENOUS | Status: AC
  Filled 2015-06-17: qty 20

## 2015-06-17 MED ORDER — SENNOSIDES-DOCUSATE SODIUM 8.6-50 MG PO TABS
1.0000 | ORAL_TABLET | Freq: Every evening | ORAL | Status: DC | PRN
  Administered 2015-06-19: 1 via ORAL
  Filled 2015-06-17: qty 1

## 2015-06-17 MED ORDER — KETOROLAC TROMETHAMINE 30 MG/ML IJ SOLN
30.0000 mg | Freq: Three times a day (TID) | INTRAMUSCULAR | Status: AC
  Administered 2015-06-17 – 2015-06-18 (×3): 30 mg via INTRAVENOUS
  Filled 2015-06-17 (×2): qty 1

## 2015-06-17 MED ORDER — SODIUM CHLORIDE 0.9 % IV SOLN
INTRAVENOUS | Status: DC | PRN
  Administered 2015-06-17: 1000 mL

## 2015-06-17 MED ORDER — MIDAZOLAM HCL 2 MG/2ML IJ SOLN
INTRAMUSCULAR | Status: AC
  Filled 2015-06-17: qty 2

## 2015-06-17 MED ORDER — POVIDONE-IODINE 10 % EX SOLN
CUTANEOUS | Status: DC | PRN
  Administered 2015-06-17: 1 via TOPICAL

## 2015-06-17 MED ORDER — 0.9 % SODIUM CHLORIDE (POUR BTL) OPTIME
TOPICAL | Status: DC | PRN
  Administered 2015-06-17 (×2): 1000 mL

## 2015-06-17 MED ORDER — ONDANSETRON HCL 4 MG/2ML IJ SOLN
INTRAMUSCULAR | Status: DC | PRN
  Administered 2015-06-17 (×2): 4 mg via INTRAVENOUS

## 2015-06-17 MED ORDER — METHOCARBAMOL 500 MG PO TABS
ORAL_TABLET | ORAL | Status: AC
  Filled 2015-06-17: qty 1

## 2015-06-17 MED ORDER — DIAZEPAM 5 MG PO TABS
ORAL_TABLET | ORAL | Status: AC
  Filled 2015-06-17: qty 1

## 2015-06-17 MED ORDER — HYDROMORPHONE HCL 1 MG/ML IJ SOLN
0.2500 mg | INTRAMUSCULAR | Status: DC | PRN
  Administered 2015-06-17: 0.5 mg via INTRAVENOUS

## 2015-06-17 MED ORDER — ONDANSETRON HCL 4 MG/2ML IJ SOLN
INTRAMUSCULAR | Status: AC
  Filled 2015-06-17: qty 8

## 2015-06-17 MED ORDER — HYDROMORPHONE HCL 1 MG/ML IJ SOLN
INTRAMUSCULAR | Status: AC
  Filled 2015-06-17: qty 1

## 2015-06-17 MED ORDER — KETOROLAC TROMETHAMINE 30 MG/ML IJ SOLN
INTRAMUSCULAR | Status: AC
  Filled 2015-06-17: qty 1

## 2015-06-17 MED ORDER — DIPHENHYDRAMINE HCL 50 MG/ML IJ SOLN
INTRAMUSCULAR | Status: AC
  Filled 2015-06-17: qty 1

## 2015-06-17 MED ORDER — ONDANSETRON 4 MG PO TBDP: 4.0000 mg | ORAL_TABLET | Freq: Four times a day (QID) | ORAL | Status: DC | PRN

## 2015-06-17 MED ORDER — LORATADINE 10 MG PO TABS: 10.0000 mg | ORAL_TABLET | Freq: Every day | ORAL | Status: DC | PRN

## 2015-06-17 MED ORDER — DEXAMETHASONE SODIUM PHOSPHATE 10 MG/ML IJ SOLN
INTRAMUSCULAR | Status: DC | PRN
  Administered 2015-06-17: 10 mg via INTRAVENOUS

## 2015-06-17 MED ORDER — LACTATED RINGERS IV SOLN
Freq: Once | INTRAVENOUS | Status: AC
  Administered 2015-06-17: 11:00:00 via INTRAVENOUS

## 2015-06-17 MED ORDER — OXYCODONE-ACETAMINOPHEN 5-325 MG PO TABS
1.0000 | ORAL_TABLET | ORAL | Status: DC | PRN
  Administered 2015-06-18: 2 via ORAL
  Administered 2015-06-18: 1 via ORAL
  Administered 2015-06-18 – 2015-06-20 (×7): 2 via ORAL
  Filled 2015-06-17: qty 2
  Filled 2015-06-17: qty 1
  Filled 2015-06-17 (×7): qty 2

## 2015-06-17 MED ORDER — MIDAZOLAM HCL 5 MG/5ML IJ SOLN
INTRAMUSCULAR | Status: DC | PRN
  Administered 2015-06-17: 2 mg via INTRAVENOUS

## 2015-06-17 MED ORDER — HYDROMORPHONE HCL 1 MG/ML IJ SOLN
0.2500 mg | INTRAMUSCULAR | Status: DC | PRN
  Administered 2015-06-17 (×4): 0.5 mg via INTRAVENOUS

## 2015-06-17 MED ORDER — DIPHENHYDRAMINE HCL 50 MG/ML IJ SOLN
12.5000 mg | Freq: Four times a day (QID) | INTRAMUSCULAR | Status: AC | PRN
  Administered 2015-06-17: 12.5 mg via INTRAVENOUS

## 2015-06-17 MED ORDER — SODIUM CHLORIDE 0.9 % IJ SOLN
INTRAMUSCULAR | Status: DC | PRN
  Administered 2015-06-17: 80 mL via INTRAVENOUS

## 2015-06-17 MED ORDER — SUGAMMADEX SODIUM 200 MG/2ML IV SOLN
INTRAVENOUS | Status: DC | PRN
  Administered 2015-06-17: 125 mg via INTRAVENOUS

## 2015-06-17 MED ORDER — LACTATED RINGERS IV SOLN
INTRAVENOUS | Status: DC | PRN
  Administered 2015-06-17 (×3): via INTRAVENOUS

## 2015-06-17 MED ORDER — METHOCARBAMOL 500 MG PO TABS
500.0000 mg | ORAL_TABLET | Freq: Four times a day (QID) | ORAL | Status: DC | PRN
  Administered 2015-06-17 – 2015-06-19 (×3): 500 mg via ORAL
  Filled 2015-06-17 (×2): qty 1

## 2015-06-17 MED ORDER — ROCURONIUM BROMIDE 50 MG/5ML IV SOLN
INTRAVENOUS | Status: AC
  Filled 2015-06-17: qty 4

## 2015-06-17 MED ORDER — ONDANSETRON HCL 4 MG/2ML IJ SOLN: 4.0000 mg | Freq: Four times a day (QID) | INTRAMUSCULAR | Status: DC | PRN

## 2015-06-17 MED ORDER — CEFAZOLIN SODIUM-DEXTROSE 2-3 GM-% IV SOLR
INTRAVENOUS | Status: AC
  Filled 2015-06-17: qty 50

## 2015-06-17 SURGICAL SUPPLY — 88 items
ADH SKN CLS APL DERMABOND .7 (GAUZE/BANDAGES/DRESSINGS)
APPLIER CLIP 9.375 MED OPEN (MISCELLANEOUS) ×2
APR CLP MED 9.3 20 MLT OPN (MISCELLANEOUS) ×1
BAG DECANTER FOR FLEXI CONT (MISCELLANEOUS) ×1 IMPLANT
BLADE 10 SAFETY STRL DISP (BLADE) ×1 IMPLANT
BLADE SURG 10 STRL SS (BLADE) ×2 IMPLANT
BLADE SURG 15 STRL LF DISP TIS (BLADE) ×1 IMPLANT
BLADE SURG 15 STRL SS (BLADE)
BNDG COHESIVE 4X5 TAN STRL (GAUZE/BANDAGES/DRESSINGS) IMPLANT
CANISTER SUCTION 2500CC (MISCELLANEOUS) ×2 IMPLANT
CHLORAPREP W/TINT 26ML (MISCELLANEOUS) ×3 IMPLANT
CLIP APPLIE 9.375 MED OPEN (MISCELLANEOUS) ×1 IMPLANT
COVER SURGICAL LIGHT HANDLE (MISCELLANEOUS) ×2 IMPLANT
DERMABOND ADVANCED (GAUZE/BANDAGES/DRESSINGS)
DERMABOND ADVANCED .7 DNX12 (GAUZE/BANDAGES/DRESSINGS) ×1 IMPLANT
DRAIN CHANNEL 15F RND FF W/TCR (WOUND CARE) ×4 IMPLANT
DRAIN CHANNEL 19F RND (DRAIN) ×2 IMPLANT
DRAPE INCISE 23X17 IOBAN STRL (DRAPES)
DRAPE INCISE 23X17 STRL (DRAPES) IMPLANT
DRAPE INCISE IOBAN 23X17 STRL (DRAPES) IMPLANT
DRAPE INCISE IOBAN 66X45 STRL (DRAPES) ×2 IMPLANT
DRAPE INCISE IOBAN 85X60 (DRAPES) IMPLANT
DRAPE ORTHO SPLIT 77X108 STRL (DRAPES) ×8
DRAPE POUCH INSTRU U-SHP 10X18 (DRAPES) ×1 IMPLANT
DRAPE PROXIMA HALF (DRAPES) ×6 IMPLANT
DRAPE SURG ORHT 6 SPLT 77X108 (DRAPES) ×2 IMPLANT
DRAPE WARM FLUID 44X44 (DRAPE) ×2 IMPLANT
DRSG MEPILEX BORDER 4X8 (GAUZE/BANDAGES/DRESSINGS) ×1 IMPLANT
DRSG PAD ABDOMINAL 8X10 ST (GAUZE/BANDAGES/DRESSINGS) ×4 IMPLANT
ELECT BLADE 4.0 EZ CLEAN MEGAD (MISCELLANEOUS) ×2
ELECT BLADE 6.5 EXT (BLADE) ×2 IMPLANT
ELECT CAUTERY BLADE 6.4 (BLADE) ×6 IMPLANT
ELECT COATED BLADE 2.86 ST (ELECTRODE) ×1 IMPLANT
ELECT REM PT RETURN 9FT ADLT (ELECTROSURGICAL) ×2
ELECTRODE BLDE 4.0 EZ CLN MEGD (MISCELLANEOUS) IMPLANT
ELECTRODE REM PT RTRN 9FT ADLT (ELECTROSURGICAL) ×1 IMPLANT
EVACUATOR SILICONE 100CC (DRAIN) ×6 IMPLANT
GAUZE SPONGE 4X4 12PLY STRL (GAUZE/BANDAGES/DRESSINGS) ×2 IMPLANT
GAUZE XEROFORM 5X9 LF (GAUZE/BANDAGES/DRESSINGS) ×1 IMPLANT
GLOVE BIO SURGEON STRL SZ 6 (GLOVE) ×6 IMPLANT
GLOVE BIO SURGEON STRL SZ7.5 (GLOVE) ×2 IMPLANT
GLOVE BIOGEL PI IND STRL 6.5 (GLOVE) IMPLANT
GLOVE BIOGEL PI IND STRL 7.0 (GLOVE) IMPLANT
GLOVE BIOGEL PI INDICATOR 6.5 (GLOVE) ×1
GLOVE BIOGEL PI INDICATOR 7.0 (GLOVE) ×2
GLOVE SURG ORTHO 7.0 STRL STRW (GLOVE) ×1 IMPLANT
GLOVE SURG SS PI 6.5 STRL IVOR (GLOVE) ×2 IMPLANT
GOWN STRL REUS W/ TWL LRG LVL3 (GOWN DISPOSABLE) ×4 IMPLANT
GOWN STRL REUS W/TWL LRG LVL3 (GOWN DISPOSABLE) ×8
IMPLANT BREAST SILICONE 275CC (Breast) ×2 IMPLANT
KIT BASIN OR (CUSTOM PROCEDURE TRAY) ×2 IMPLANT
LIQUID BAND (GAUZE/BANDAGES/DRESSINGS) ×4 IMPLANT
MENTOR MEMORY GEL SIZER ×1 IMPLANT
NDL HYPO 25GX1X1/2 BEV (NEEDLE) IMPLANT
NEEDLE 22X1 1/2 (OR ONLY) (NEEDLE) ×1 IMPLANT
NEEDLE HYPO 25GX1X1/2 BEV (NEEDLE) ×2 IMPLANT
NS IRRIG 1000ML POUR BTL (IV SOLUTION) ×4 IMPLANT
PACK GENERAL/GYN (CUSTOM PROCEDURE TRAY) ×2 IMPLANT
PAD ARMBOARD 7.5X6 YLW CONV (MISCELLANEOUS) ×6 IMPLANT
PEN SKIN MARKING BROAD (MISCELLANEOUS) ×1 IMPLANT
PENCIL BUTTON HOLSTER BLD 10FT (ELECTRODE) ×2 IMPLANT
PIN SAFETY STERILE (MISCELLANEOUS) ×1 IMPLANT
SET COLLECT BLD 21X3/4 12 PB (MISCELLANEOUS) IMPLANT
SIZER BREAST 275CC (SIZER) ×2
SIZER BRST P3.7-4.2XMDRT275CC (SIZER) IMPLANT
SPONGE GAUZE 4X4 12PLY STER LF (GAUZE/BANDAGES/DRESSINGS) ×2 IMPLANT
SPONGE LAP 18X18 X RAY DECT (DISPOSABLE) ×2 IMPLANT
STAPLER VISISTAT 35W (STAPLE) ×3 IMPLANT
STOCKINETTE IMPERVIOUS 9X36 MD (GAUZE/BANDAGES/DRESSINGS) IMPLANT
STOPCOCK 4 WAY LG BORE MALE ST (IV SETS) IMPLANT
STRIP CLOSURE SKIN 1/2X4 (GAUZE/BANDAGES/DRESSINGS) ×2 IMPLANT
SUT ETHILON 2 0 FS 18 (SUTURE) ×5 IMPLANT
SUT MNCRL AB 3-0 PS2 18 (SUTURE) ×2 IMPLANT
SUT MNCRL AB 4-0 PS2 18 (SUTURE) ×8 IMPLANT
SUT MON AB 5-0 PS2 18 (SUTURE) ×2 IMPLANT
SUT PDS AB 2-0 CT1 27 (SUTURE) ×11 IMPLANT
SUT SILK 2 0 FS (SUTURE) ×1 IMPLANT
SUT VIC AB 3-0 PS2 18 (SUTURE)
SUT VIC AB 3-0 PS2 18XBRD (SUTURE) ×4 IMPLANT
SUT VIC AB 3-0 SH 8-18 (SUTURE) ×2 IMPLANT
SUT VIC AB 4-0 PS2 27 (SUTURE) ×3 IMPLANT
SUT VLOC 180 0 24IN GS25 (SUTURE) ×4 IMPLANT
SYR 50ML SLIP (SYRINGE) ×1 IMPLANT
SYR BULB IRRIGATION 50ML (SYRINGE) ×2 IMPLANT
TOWEL OR 17X24 6PK STRL BLUE (TOWEL DISPOSABLE) ×2 IMPLANT
TOWEL OR 17X26 10 PK STRL BLUE (TOWEL DISPOSABLE) ×2 IMPLANT
TRAY FOLEY CATH 14FRSI W/METER (CATHETERS) ×2 IMPLANT
TUBE CONNECTING 12X1/4 (SUCTIONS) ×2 IMPLANT

## 2015-06-17 NOTE — Anesthesia Preprocedure Evaluation (Addendum)
Anesthesia Evaluation  Patient identified by MRN, date of birth, ID band Patient awake    Reviewed: Allergy & Precautions, H&P , NPO status , Patient's Chart, lab work & pertinent test results  History of Anesthesia Complications (+) PONV  Airway Mallampati: II  TM Distance: >3 FB Neck ROM: Full    Dental no notable dental hx. (+) Teeth Intact, Dental Advisory Given   Pulmonary neg pulmonary ROS,    Pulmonary exam normal breath sounds clear to auscultation       Cardiovascular negative cardio ROS   Rhythm:Regular Rate:Normal     Neuro/Psych Anxiety negative neurological ROS     GI/Hepatic Neg liver ROS, GERD  Medicated and Controlled,  Endo/Other  negative endocrine ROS  Renal/GU negative Renal ROS  negative genitourinary   Musculoskeletal  (+) Arthritis , Osteoarthritis,  Fibromyalgia -  Abdominal   Peds  Hematology negative hematology ROS (+) anemia ,   Anesthesia Other Findings   Reproductive/Obstetrics negative OB ROS                            Anesthesia Physical Anesthesia Plan  ASA: II  Anesthesia Plan: General   Post-op Pain Management:    Induction: Intravenous  Airway Management Planned: Oral ETT  Additional Equipment:   Intra-op Plan:   Post-operative Plan: Extubation in OR  Informed Consent: I have reviewed the patients History and Physical, chart, labs and discussed the procedure including the risks, benefits and alternatives for the proposed anesthesia with the patient or authorized representative who has indicated his/her understanding and acceptance.   Dental advisory given  Plan Discussed with: CRNA  Anesthesia Plan Comments:         Anesthesia Quick Evaluation

## 2015-06-17 NOTE — Interval H&P Note (Signed)
History and Physical Interval Note:  06/17/2015 7:15 AM  Julie [name]  has presented today for surgery, with the diagnosis of ACQUIRED ABSCENCE OF BILATERAL BREAST,HISTORY OF LEFT BREAST CANCER  The various methods of treatment have been discussed with the patient and family. After consideration of risks, benefits and other options for treatment, the patient has consented to  Procedure(s): BILATERAL LATISSIMUS FLAP FOR BILATERAL BREAST RECONSTRUCTION AND PLACEMENT OF BILATERAL SILICONE IMPLANTS (Bilateral) as a surgical intervention .  The patient's history has been reviewed, patient examined, no change in status, stable for surgery.  I have reviewed the patient's chart and labs.  Questions were answered to the patient's satisfaction.     Gillie Fleites

## 2015-06-17 NOTE — Transfer of Care (Signed)
Immediate Anesthesia Transfer of Care Note  Patient: Julie [name]  Procedure(s) Performed: Procedure(s): BILATERAL LATISSIMUS FLAP FOR BILATERAL BREAST RECONSTRUCTION AND PLACEMENT OF BILATERAL SILICONE IMPLANTS (Bilateral)  Patient Location: PACU  Anesthesia Type:General  Level of Consciousness: awake, alert  and patient cooperative  Airway & Oxygen Therapy: Patient Spontanous Breathing and Patient connected to nasal cannula oxygen  Post-op Assessment: Report given to RN and Post -op Vital signs reviewed and stable  Post vital signs: Reviewed and stable  Last Vitals:  Filed Vitals:   06/17/15 1049  BP: 115/69  Pulse: 69  Temp: 36.8 C  Resp: 20    Complications: No apparent anesthesia complications

## 2015-06-17 NOTE — Progress Notes (Signed)
New Admission Note:   Arrival Method: Arrived from PACU via bed Mental Orientation: Alert and oriented x4 Telemetry: N/A Assessment: Completed Skin:See doc flowsheet IV: RfA Pain:  Tubes:JP drains  Safety Measures: Safety Fall Prevention Plan has been given, discussed and signed Admission: Completed [address] Orientation: Patient has been orientated to the room, unit and staff.  Family: Husband at bedside  Orders have been reviewed and implemented. Will continue to monitor the patient. Call light has been placed within reach and bed alarm has been activated.   [employer], RN-BC Phone number: [phone]

## 2015-06-17 NOTE — Anesthesia Postprocedure Evaluation (Signed)
Anesthesia Post Note  Patient: Julie [name]  Procedure(s) Performed: Procedure(s) (LRB): BILATERAL LATISSIMUS FLAP FOR BILATERAL BREAST RECONSTRUCTION AND PLACEMENT OF BILATERAL SILICONE IMPLANTS (Bilateral)  Patient location during evaluation: PACU Anesthesia Type: General Level of consciousness: awake Pain management: pain level controlled Vital Signs Assessment: post-procedure vital signs reviewed and stable Respiratory status: spontaneous breathing Cardiovascular status: stable Anesthetic complications: no    Last Vitals:  Filed Vitals:   06/17/15 2015 06/17/15 2030  BP: 125/65 112/62  Pulse: 74 66  Temp:    Resp: 12 15    Last Pain:  Filed Vitals:   06/17/15 2034  PainSc: 9                  EDWARDS,Jamone Garrido

## 2015-06-17 NOTE — Anesthesia Procedure Notes (Signed)
Procedure Name: Intubation Date/Time: 06/17/2015 1:47 PM Performed by: Shirlyn Goltz Pre-anesthesia Checklist: Patient identified, Emergency Drugs available, Suction available and Patient being monitored Patient Re-evaluated:Patient Re-evaluated prior to inductionOxygen Delivery Method: Circle system utilized Preoxygenation: Pre-oxygenation with 100% oxygen Intubation Type: IV induction Ventilation: Mask ventilation without difficulty Laryngoscope Size: Mac and 3 Grade View: Grade II Tube type: Oral Tube size: 7.0 mm Number of attempts: 1 Airway Equipment and Method: Stylet Placement Confirmation: ETT inserted through vocal cords under direct vision,  positive ETCO2 and breath sounds checked- equal and bilateral Secured at: 22 cm Tube secured with: Tape Dental Injury: Teeth and Oropharynx as per pre-operative assessment

## 2015-06-17 NOTE — Progress Notes (Signed)
Report attempted 

## 2015-06-17 NOTE — Op Note (Signed)
Operative Note   DATE OF OPERATION: 1.11.2017  LOCATION: [facility]- inpatient  SURGICAL DIVISION: Plastic Surgery  PREOPERATIVE DIAGNOSES:  1. History breast cancer 2. Acquired absence bilateral breasts   POSTOPERATIVE DIAGNOSES:  same  PROCEDURE:  1. Bilateral latissimus flaps for breast reconstruction 2. Placement silicone implants for bilateral breast reconstruction  SURGEON: Irene Limbo MD MBA  ASSISTANT: P. Toth MD  ANESTHESIA:  General.   EBL: 123XX123 ml  COMPLICATIONS: None immediate.   INDICATIONS FOR PROCEDURE:  The patient, Julie [name], is a 50 y.o. female born on 1965-09-28, is here for delayed breast reconstruction. Patient underwent nipple sparing mastectomies with immediate placement tissue expanders. This was complicated by mastectomy flap necrosis and bilateral explant expanders. She now presents for reconstruction with latissimus flap.    FINDINGS: Mentor Siltex High Profile 275 ml implants placed bilateral. Ref [id number] RIGHT SN [id number] LEFT [id number]  DESCRIPTION OF PROCEDURE:  The patient's operative site was marked with the patient in the preoperative area. Skin paddles over back designed transversely at level of inframammary fold. Chest midline, anterior axillary line, and inframammary folds marked. The patient was taken to the operating room. SCDs were placed and IV antibiotics were given. Following induction, the patient was placed in prone position. The patient's operative site was prepped and draped in a sterile fashion. A time out was performed and all information was confirmed to be correct.  Incision made surrounding skin paddle designed over right back. Skin and superficial fascia elevated off surface of latissimus muscle and subcutaneous tunnel dissected to axilla. Anterior border of latissimus identified and elevated. Muscle divided inferiorly at superior iliac spine. Submuscular dissection completed toward midline back and toward origin.  Thoracodorsal nerve was not divided. Flap rotated into axilla. Back irrigated and hemostasis ensured. 15 fr drain placed and secured with 2-0 nylon. 2-0 PDS used to placed quilting sutures from elevated skin flaps to chest wall. Incision closed with 0 V lock suture in superficial fascia and dermis. Skin closure completed with 4-0 monocryl subcuticular and tissue glue applied. Over left back, incision made surrounding skin paddle. Skin and superficial fascia elevated off surface of latissimus muscle and subcutaneous tunnel dissected to axilla. Anterior border of latissimus identified and elevated. Muscle divided inferiorly and submuscular dissection completed toward muscle origin. Thoracodorsal nerve was not divided. Flap rotated into axilla. Back irrigated, 15 fr drain placed and closure completed in similar manner.  The drapes were then removed and patient moved to supine position. The chest was prepped and draped in sterile fashion. I began on left breast. Vertical excision of scar over lower pole completed. Skin flaps elevated. The pectoralis muscle that had been previously elevated was dissected free from overlying skin flap. The pectoralis muscle was re inset to chest wall with 0 V lock suture. Dissection then completed over lateral chest wall and axilla to deliver the latissimus flap. The muscle was oriented and inset to pectoralis muscle and serratus with interrupted 2-0 PDS. Implant sizer placed and skin paddle marked, excess skin paddle largely excised. Sizer was removed and 19 Fr JP placed in cavity. The cavity was irrigated with solution containing Ancef, gentamicin, and bacitracin. Hemostasis ensured. A Mentor Siltex High Profile 275 ml implant placed beneath latissimus muscle and muscle secured to chest wall over implant with interrupted 2-0 PDS. The skin paddle was inset to skin defect with 4-0 vicryl in dermis and 4-0 monocryl subcuticular skin closure.  I then made incision over prior right  inframammary scar. The skin flap  was elevated and the pectoralis muscle was again freed from skin flap and inset to chest wall with 0 V lock suture. The flap was delivered through axilla and lateral chest wall. Muscle was oriented. On the right side the skin paddle was excised entirely. The latissimus muscle inset to pectoralis muscle and chest wall with 2-0 PDS. Following irrigation of cavity and 19 Fr drain placement, a similar 275 ml implant placed. The remainder of latissimus inset to chest wall over implant. Incision closed in layers wth 3-0 vicryl in superficial fascia, 4-0 vicryl in dermis and 4-0 monocryl subcuticular skin closure. Tissue adhesive applied.   Prior to closure of each site, Exparel infiltrated total 266 mg for bilateral chest and back.  Dry dressing and breast binder applied.   The patient was allowed to wake from anesthesia, extubated and taken to the recovery room in satisfactory condition.   SPECIMENS: none  DRAINS: 19 Fr JP in right and left reconstructed breast, 15 Fr JP in right and left back.  Irene Limbo, MD [facility] Plastic & Reconstructive Surgery [phone]

## 2015-06-18 ENCOUNTER — Encounter (HOSPITAL_COMMUNITY): Payer: Self-pay | Admitting: General Practice

## 2015-06-18 MED ORDER — DIPHENHYDRAMINE HCL 25 MG PO CAPS
25.0000 mg | ORAL_CAPSULE | Freq: Four times a day (QID) | ORAL | Status: DC | PRN
  Administered 2015-06-19: 50 mg via ORAL
  Filled 2015-06-18: qty 2

## 2015-06-18 MED ORDER — DIPHENHYDRAMINE HCL 50 MG/ML IJ SOLN: 25.0000 mg | Freq: Four times a day (QID) | INTRAMUSCULAR | Status: DC | PRN

## 2015-06-18 NOTE — Progress Notes (Signed)
  POD#1 bilateral latissimus flaps, placement silicone implants  Temp:  [97.7 F (36.5 C)-99.2 F (37.3 C)] 98.8 F (37.1 C) (01/12 [id number]) Pulse Rate:  [65-84] 65 (01/12 0632) Resp:  [11-20] 16 (01/12 0632) BP: (101-129)/(48-85) 117/59 mmHg (01/12 0632) SpO2:  [96 %-100 %] 98 % (01/12 [id number]) Weight:  [60.328 kg (133 lb)-62.551 kg (137 lb 14.4 oz)] 62.551 kg (137 lb 14.4 oz) (01/11 2300)   PO 240 JP 45/15/65/85 -- two drains labeled "right medial" in EPIC  PE Back incisions dry flat Breasts soft, expected edema, left skin paddle soft viable   A/P Foley out this am OOB ambulate Pain control Drains corrected in chart  Irene Limbo, MD [facility] Plastic & Reconstructive Surgery [phone]

## 2015-06-18 NOTE — Progress Notes (Signed)
Utilization review completed. Zuma Hust, RN, BSN. 

## 2015-06-19 MED ORDER — IBUPROFEN 400 MG PO TABS
400.0000 mg | ORAL_TABLET | Freq: Three times a day (TID) | ORAL | Status: DC
  Administered 2015-06-19 – 2015-06-20 (×4): 400 mg via ORAL
  Filled 2015-06-19 (×4): qty 1

## 2015-06-19 NOTE — Progress Notes (Signed)
  POD#2 bilateral latissimus flaps, placement silicone implants  Temp:  [98.3 F (36.8 C)-99.3 F (37.4 C)] 98.4 F (36.9 C) (01/13 0510) Pulse Rate:  [66-99] 70 (01/13 0510) Resp:  [15-20] 15 (01/13 0510) BP: (92-100)/(46-58) 97/58 mmHg (01/13 0510) SpO2:  [98 %-99 %] 98 % (01/13 0510)   PO 600 JP 190/100/155/120  Ambulated in hall Transferred to 6N Woke up at 5 am, more pain today, received IV pain medication  PE Back incisions dry flat Breasts soft,  left skin paddle soft viable, left nipple with epidermolysis and serous drainage   A/P Pain control, still requiring IV medication Encouraged to ambulate once pain controlled in halls again [facility] to shower if desired. Will need vaseline or bacitracin to left nipple. Motrin with meals ATC  Irene Limbo, MD [facility] Plastic & Reconstructive Surgery [phone]

## 2015-06-20 MED ORDER — SULFAMETHOXAZOLE-TRIMETHOPRIM 800-160 MG PO TABS: 1.0000 | ORAL_TABLET | Freq: Two times a day (BID) | ORAL | Status: DC

## 2015-06-20 MED ORDER — DIAZEPAM 5 MG PO TABS: 5.0000 mg | ORAL_TABLET | Freq: Three times a day (TID) | ORAL | Status: DC | PRN

## 2015-06-20 MED ORDER — FLUOXETINE HCL 20 MG PO TABS: 20.0000 mg | ORAL_TABLET | Freq: Every day | ORAL | Status: DC

## 2015-06-20 MED ORDER — BACITRACIN ZINC 500 UNIT/GM EX OINT
TOPICAL_OINTMENT | Freq: Two times a day (BID) | CUTANEOUS | Status: DC
  Administered 2015-06-20: 12:00:00 via TOPICAL
  Filled 2015-06-20: qty 28.35

## 2015-06-20 MED ORDER — OXYCODONE-ACETAMINOPHEN 5-325 MG PO TABS: 1.0000 | ORAL_TABLET | ORAL | Status: DC | PRN

## 2015-06-20 NOTE — Progress Notes (Signed)
Discharge home. Home discharge instruction given, no question verbalized. 

## 2015-06-20 NOTE — Discharge Summary (Signed)
Physician Discharge Summary  Patient ID: Julie [name] MRN: [id number] DOB/AGE: [birth year] 50 y.o.  Admit date: 06/17/2015 Discharge date: 06/20/2015  Admission Diagnoses: History breast cancer, acquired absence breasts Discharge Diagnoses:  Active Problems:   History of breast cancer   Discharged Condition: stable  Hospital Course: Postoperatively patient required IV pain medication throughout POD 1-2. She tolerated diet well. She experienced one episode diarrhea post laxative administration. She was maintained on IV Ancef and will transition to Bactrim as outpatient. She was noted on POD2 to have epidermolysis left nipple and developed scab. This will be treated with local wound care.  Treatments: surgery: bilateral latissimus flaps for breast reconstruction, placement silicone implant  Discharge Exam: Blood pressure 97/63, pulse 55, temperature 97.9 F (36.6 C), temperature source Oral, resp. rate 16, height 5\' 5"  (1.651 m), weight 62.551 kg (137 lb 14.4 oz), last menstrual period 06/13/2015, SpO2 99 %. Incision/Wound: back incisions dry flat, right breast soft incision intact, left breast with viable skin paddle flap and scab over left nipple JPs serosanguinous  Disposition: 01-Home or Self Care  Discharge Instructions    Call MD for:  redness, tenderness, or signs of infection (pain, swelling, bleeding, redness, odor or green/yellow discharge around incision site)    Complete by:  As directed      Call MD for:  temperature >100.5    Complete by:  As directed      Discharge instructions    Complete by:  As directed   Ok to shower. Soap and water ok. Apply vaseline or Aquaphor to left nipple and cover with dry dressing.  Strip and record drains twice daily and bring log to clinic visit.  Breast binder or soft support bra for comfort.  No strenuous activity or exercise. Ok to raise arms above shoulders for bathing and dressing.  Take ibuprofen (Motrin, Advil) 400 mg with  meals if eating well.     Driving Restrictions    Complete by:  As directed   No driving     Lifting restrictions    Complete by:  As directed   No lifting greater than 5 lbs     Resume previous diet    Complete by:  As directed             Medication List    STOP taking these medications        FLUoxetine 20 MG capsule  Commonly known as:  PROZAC  Replaced by:  FLUoxetine 20 MG tablet      TAKE these medications        calcium carbonate 500 MG chewable tablet  Commonly known as:  TUMS - dosed in mg elemental calcium  Chew 1 tablet by mouth daily as needed for indigestion or heartburn.     cetirizine-pseudoephedrine 5-120 MG tablet  Commonly known as:  ZYRTEC-D  Take 1 tablet by mouth daily as needed for allergies.     diazepam 5 MG tablet  Commonly known as:  VALIUM  Take 1 tablet (5 mg total) by mouth every 8 (eight) hours as needed for anxiety or muscle spasms.     FLUoxetine 20 MG tablet  Commonly known as:  PROZAC  Take 1 tablet (20 mg total) by mouth daily.     loratadine 10 MG tablet  Commonly known as:  CLARITIN  Take 10 mg by mouth daily as needed for allergies.     omeprazole 20 MG capsule  Commonly known as:  PRILOSEC  Take 20 mg by  mouth as needed.     oxyCODONE-acetaminophen 5-325 MG tablet  Commonly known as:  PERCOCET/ROXICET  Take 1-2 tablets by mouth every 4 (four) hours as needed for moderate pain.     promethazine 25 MG tablet  Commonly known as:  PHENERGAN  Take 25 mg by mouth every 6 (six) hours as needed for nausea or vomiting.     sulfamethoxazole-trimethoprim 800-160 MG tablet  Commonly known as:  BACTRIM DS,SEPTRA DS  Take 1 tablet by mouth 2 (two) times daily.     tamoxifen 20 MG tablet  Commonly known as:  NOLVADEX  Take 1 tablet (20 mg total) by mouth daily.           Follow-up Information    Follow up with Irene Limbo, MD On 06/24/2015.   Specialty:  Plastic Surgery   Why:  as scheduled   Contact information:    [address] [place] [address] [phone]       Signed: Irene Limbo 06/20/2015, 10:39 AM

## 2015-06-20 NOTE — Progress Notes (Signed)
  POD#3 bilateral latissimus flaps, placement silicone implants  Temp:  [97.9 F (36.6 C)-98.3 F (36.8 C)] 97.9 F (36.6 C) (01/14 0601) Pulse Rate:  [55-65] 55 (01/14 0601) Resp:  [16] 16 (01/14 0601) BP: (97-103)/(58-63) 97/63 mmHg (01/14 0601) SpO2:  [98 %-99 %] 99 % (01/14 0601)   PO 600 JP 165/90/105/135 - drains 3 and 4 also have output recorded under "input" in EPIC  Had diarrhea post administration of laxative, this was d/c Pain improved with waking up at 6 to take meds  PE Back incisions dry flat Breasts soft,  left skin paddle soft viable, left nipple scab   A/P Shower today, home this pm. Vaseline/Aquaphor or antibiotic ointment to left nipple and dry dressing Home this pm Fu arranged  Irene Limbo, MD [facility] Plastic & Reconstructive Surgery [phone]

## 2015-06-25 ENCOUNTER — Encounter (HOSPITAL_COMMUNITY): Payer: Self-pay | Admitting: Plastic Surgery

## 2015-08-10 NOTE — Assessment & Plan Note (Signed)
Left breast multifocal invasive ductal carcinoma 0.8 cm and 0.9 cm, ER/PR positive HER-2 negative (ratio is 0.97 and 1.23) Ki-67 ranged 14-16% status post bilateral mastectomies 4 SLN negative.Oncotype DX recurrence score 9, 7 percent risk of recurrence; currently on tamoxifen X10 years. Due to infections, the breast expanders had to be removed. She is not planning a reconstruction until fall. Patient plans to begin breast reconstruction prosthesis again in January 2017.  Tamoxifen toxicities: Patient denies any hot flashes or muscle aches Her periods have resumed and she sees a gynecologist once a year and that is coming up. I discussed with her about switching Prozac to Effexor. She plans to stay on Prozac because it has worked very well for her or long period of time.  Breast cancer surveillance: 1. Chest and axillary exams every 6 months 2. No role of imaging because she had bilateral mastectomies  Return to clinic in 6 months for follow-up

## 2015-08-11 ENCOUNTER — Encounter: Payer: Self-pay | Admitting: Hematology and Oncology

## 2015-08-11 ENCOUNTER — Telehealth: Payer: Self-pay | Admitting: Hematology and Oncology

## 2015-08-11 ENCOUNTER — Ambulatory Visit (HOSPITAL_BASED_OUTPATIENT_CLINIC_OR_DEPARTMENT_OTHER): Payer: BLUE CROSS/BLUE SHIELD | Admitting: Hematology and Oncology

## 2015-08-11 VITALS — BP 111/72 | HR 67 | Temp 97.9°F | Resp 18 | Ht 65.0 in | Wt 128.3 lb

## 2015-08-11 DIAGNOSIS — C50212 Malignant neoplasm of upper-inner quadrant of left female breast: Secondary | ICD-10-CM

## 2015-08-11 NOTE — Progress Notes (Signed)
Patient Care Team: Maisie Fus, MD as PCP - General (Obstetrics and Gynecology)  DIAGNOSIS: Breast cancer of upper-inner quadrant of left female breast [facility])   Staging form: Breast, AJCC 7th Edition     Pathologic stage from 04/14/2014: Stage IA (T1b, N0, cM0) - Signed by Rulon Eisenmenger, MD on 05/15/2014       Staging comments: Staged from final excisional specimen report by Dr. Tresa Moore      Clinical: Stage IA (T1b(2), N0, M0) - Unsigned  SUMMARY OF ONCOLOGIC HISTORY:   Breast cancer of upper-inner quadrant of left female breast ([place])   02/25/2014 Initial Biopsy Left breast mass 11:00: Invasive ductal carcinoma grade 1, ER 100%, PR 99%, Ki-67 14%, HER-2 negative ratio 1.5   03/05/2014 Breast MRI Left breast 11:00, 10 x 7 x 7 mm, in addition 7 x 5 x 6 mm masslike area, negative lymph nodes   04/11/2014 Surgery Bilateral mastectomies: Left breast: Multifocal IDC 0.8 cm and 0.9 cm with DCIS 4 SLN negative, bilateral nipple biopsy is negative: Right breast mastectomy no malignancy 2 lymph nodes negative: ER/PR positive HER-2 negative Ki-67 14-16%    Procedure Genetic testing did not reveal any mutations   05/27/2014 -  Anti-estrogen oral therapy Tamoxifen 20 mg daily 10 years   06/17/2015 Surgery Breast reconstruction with left isthmus dorsi muscle flap and an implant    CHIEF COMPLIANT: Follow-up on tamoxifen therapy  INTERVAL HISTORY: Julie [name] is a 49 year old with above-mentioned history of left breast cancer underwent bilateral mastectomies and most recently underwent latissimus dorsi muscle flap breast reconstruction. She is recovered very well from this procedure. She does not have any signs or symptoms of toxicities from tamoxifen.  REVIEW OF SYSTEMS:   Constitutional: Denies fevers, chills or abnormal weight loss Eyes: Denies blurriness of vision Ears, nose, mouth, throat, and face: Denies mucositis or sore throat Respiratory: Denies cough, dyspnea or wheezes Cardiovascular:  Denies palpitation, chest discomfort Gastrointestinal:  Denies nausea, heartburn or change in bowel habits Skin: Denies abnormal skin rashes Lymphatics: Denies new lymphadenopathy or easy bruising Neurological:Denies numbness, tingling or new weaknesses Behavioral/Psych: Mood is stable, no new changes  Extremities: No lower extremity edema Breast: Recent breast reconstruction All other systems were reviewed with the patient and are negative.  I have reviewed the past medical history, past surgical history, social history and family history with the patient and they are unchanged from previous note.  ALLERGIES:  is allergic to codeine and hydrocodone.  MEDICATIONS:  Current Outpatient Prescriptions  Medication Sig Dispense Refill  . calcium carbonate (TUMS - DOSED IN MG ELEMENTAL CALCIUM) 500 MG chewable tablet Chew 1 tablet by mouth daily as needed for indigestion or heartburn.    . cetirizine-pseudoephedrine (ZYRTEC-D) 5-120 MG tablet Take 1 tablet by mouth daily as needed for allergies.    . diazepam (VALIUM) 5 MG tablet Take 1 tablet (5 mg total) by mouth every 8 (eight) hours as needed for anxiety or muscle spasms. 30 tablet 0  . FLUoxetine (PROZAC) 20 MG tablet Take 1 tablet (20 mg total) by mouth daily. 30 tablet 0  . loratadine (CLARITIN) 10 MG tablet Take 10 mg by mouth daily as needed for allergies.    Marland Kitchen omeprazole (PRILOSEC) 20 MG capsule Take 20 mg by mouth as needed.   3  . oxyCODONE-acetaminophen (PERCOCET/ROXICET) 5-325 MG tablet Take 1-2 tablets by mouth every 4 (four) hours as needed for moderate pain. 50 tablet 0  . promethazine (PHENERGAN) 25 MG tablet  Take 25 mg by mouth every 6 (six) hours as needed for nausea or vomiting.    . sulfamethoxazole-trimethoprim (BACTRIM DS,SEPTRA DS) 800-160 MG tablet Take 1 tablet by mouth 2 (two) times daily. 10 tablet 0  . tamoxifen (NOLVADEX) 20 MG tablet Take 1 tablet (20 mg total) by mouth daily. 90 tablet 3   No current  facility-administered medications for this visit.    PHYSICAL EXAMINATION: ECOG PERFORMANCE STATUS: 1 - Symptomatic but completely ambulatory  Filed Vitals:   08/11/15 1452  BP: 111/72  Pulse: 67  Temp: 97.9 F (36.6 C)  Resp: 18   Filed Weights   08/11/15 1452  Weight: 128 lb 4.8 oz (58.196 kg)    GENERAL:alert, no distress and comfortable SKIN: skin color, texture, turgor are normal, no rashes or significant lesions EYES: normal, Conjunctiva are pink and non-injected, sclera clear OROPHARYNX:no exudate, no erythema and lips, buccal mucosa, and tongue normal  NECK: supple, thyroid normal size, non-tender, without nodularity LYMPH:  no palpable lymphadenopathy in the cervical, axillary or inguinal LUNGS: clear to auscultation and percussion with normal breathing effort HEART: regular rate & rhythm and no murmurs and no lower extremity edema ABDOMEN:abdomen soft, non-tender and normal bowel sounds MUSCULOSKELETAL:no cyanosis of digits and no clubbing  NEURO: alert & oriented x 3 with fluent speech, no focal motor/sensory deficits EXTREMITIES: No lower extremity edema  LABORATORY DATA:  I have reviewed the data as listed   Chemistry      Component Value Date/Time   NA 138 06/12/2015 1522   NA 139 02/12/2015 1410   K 3.9 06/12/2015 1522   K 3.8 02/12/2015 1410   CL 106 06/12/2015 1522   CO2 24 06/12/2015 1522   CO2 24 02/12/2015 1410   BUN 8 06/12/2015 1522   BUN 8.0 02/12/2015 1410   CREATININE 0.71 06/12/2015 1522   CREATININE 0.8 02/12/2015 1410      Component Value Date/Time   CALCIUM 8.6* 06/12/2015 1522   CALCIUM 9.0 02/12/2015 1410   ALKPHOS 61 02/12/2015 1410   AST 19 02/12/2015 1410   ALT 14 02/12/2015 1410   BILITOT 0.63 02/12/2015 1410       Lab Results  Component Value Date   WBC 6.5 06/12/2015   HGB 11.9* 06/12/2015   HCT 37.7 06/12/2015   MCV 84.9 06/12/2015   PLT 184 06/12/2015   NEUTROABS 3.7 06/12/2015   ASSESSMENT & PLAN:  Breast  cancer of upper-inner quadrant of left female breast Left breast multifocal invasive ductal carcinoma 0.8 cm and 0.9 cm, ER/PR positive HER-2 negative (ratio is 0.97 and 1.23) Ki-67 ranged 14-16% status post bilateral mastectomies 4 SLN negative.Oncotype DX recurrence score 9, 7 percent risk of recurrence; currently on tamoxifen X10 years Started 05/26/2015 Due to infections, the breast expanders had to be removed.  Patient had undergone breast reconstruction with latissimus dorsi flap in January 2017.  Tamoxifen toxicities: Patient denies any hot flashes or muscle aches Her periods have resumed and she sees a gynecologist once a year and that is coming up. I discussed with her about switching Prozac to Effexor. She will discuss this with her primary care physician   Breast cancer surveillance: 1. Chest and axillary exams every 6 months: Breast exam 08/11/2015 is without any concerns 2. No role of imaging because she had bilateral mastectomies I discussed with her that if she develops any symptoms then we will investigate that with a CT scan or bone scan. Return to clinic in 6 months for follow-up  No orders of the defined types were placed in this encounter.   The patient has a good understanding of the overall plan. she agrees with it. she will call with any problems that may develop before the next visit here.   Rulon Eisenmenger, MD 08/11/2015

## 2015-08-11 NOTE — Telephone Encounter (Signed)
appt made and avs printed °

## 2015-08-13 ENCOUNTER — Telehealth: Payer: Self-pay | Admitting: *Deleted

## 2015-08-13 ENCOUNTER — Ambulatory Visit: Payer: BLUE CROSS/BLUE SHIELD | Admitting: Hematology and Oncology

## 2015-08-13 NOTE — Telephone Encounter (Signed)
"  I started spotting yesterday.  I have not had irregularity in my menstural cycle and every 28 to 35 days I have a cycle.  The last cycle I had was the week before my reconstructive surgery on 06-17-2015.  The amount of blood is faint and the spotting comes and goes.  No cramping or pain.  Is the tamoxifen causing this?  I don't want anything missed so what do I need to do?  I will see my GYN August 24, 2015."  Informed her Tamoxifen can cause irregularities.  Will notify providers and will call if any instructions or orders.  Return number [phone].   Aware Dr. Lindi Adie due to return 08-17-2015.

## 2015-08-14 ENCOUNTER — Telehealth: Payer: Self-pay

## 2015-08-14 NOTE — Telephone Encounter (Signed)
Pt reports vaginal bleeding - pink tinge starting Wednesday.  Thursday more bleeding dark brown with clots.  Friday dark brown with clots.  Pt reports she has been on tamoxifen for 2 years.  Advised pt to see her gyn.  Gyn will contact clinic if there is anything of concern.  Pt voiced understanding - reports she already has an appt with Dr. Nori Riis on March 20.    Pt reports back pain and fatigue since surgery in January.  Writer advised pt could still be recovery from surgery.  If does not resolve or symptoms worsen, pt should contact clinic to be seen.

## 2015-08-14 NOTE — Telephone Encounter (Signed)
Patient calling today regarding abnormal bleeding issues.  Call transferred to [place], RN.

## 2015-08-14 NOTE — Telephone Encounter (Signed)
Call transferred in from triage.

## 2015-09-29 ENCOUNTER — Telehealth: Payer: Self-pay

## 2015-09-29 NOTE — Telephone Encounter (Signed)
Pt called regarding symptoms she has been experiencing since coming off of Tamoxifen.  Per previous telephone notes, pt was experiencing spotting and clots and it was recommended for her to be seen by her gynecologist.  Pt saw gynecologist; Dr. Nori Riis, who ultimately did an [place] and endometrial biopsy.  Pt was thus diagnosed with adenomyosis and is scheduled for a complete hysterectomy on 10/20/15.  Pt then took advice of Dr. Nori Riis and Dr. Leland Johns who recommended pt stop Tamoxifen.  Pt states she stopped Tamoxifen around 2.5 to 3 weeks ago and ever since she has experienced what she refers to as "withdrawal" symptoms.  Pt reports worsening body aches apart from her baseline arthritis, episodes of uncontrollable crying, and depression.  Pt calling to confirm this is normal.  I spoke with pt and we discussed what some individuals experience when coming off of Tamoxifen. Pt feels much better once hearing this all explained to her.  Pt currently on 75mg  of Effexor and denies any suicidal ideation at this time.  Pt verbalizes an understanding to call if this should change.  Pt instructed to try ibuprofen for arthritis aches and to let us know if this does not seem to improve.  No further questions or concerns at time of call.

## 2015-10-20 ENCOUNTER — Other Ambulatory Visit: Payer: Self-pay | Admitting: Obstetrics & Gynecology

## 2015-11-12 ENCOUNTER — Encounter: Payer: Self-pay | Admitting: Hematology and Oncology

## 2015-11-12 ENCOUNTER — Ambulatory Visit (HOSPITAL_BASED_OUTPATIENT_CLINIC_OR_DEPARTMENT_OTHER): Payer: BLUE CROSS/BLUE SHIELD | Admitting: Hematology and Oncology

## 2015-11-12 ENCOUNTER — Telehealth: Payer: Self-pay | Admitting: Hematology and Oncology

## 2015-11-12 VITALS — BP 128/88 | HR 72 | Temp 98.8°F | Resp 18 | Ht 65.0 in | Wt 129.7 lb

## 2015-11-12 DIAGNOSIS — C50212 Malignant neoplasm of upper-inner quadrant of left female breast: Secondary | ICD-10-CM | POA: Diagnosis not present

## 2015-11-12 DIAGNOSIS — E538 Deficiency of other specified B group vitamins: Secondary | ICD-10-CM | POA: Diagnosis not present

## 2015-11-12 DIAGNOSIS — R5383 Other fatigue: Secondary | ICD-10-CM

## 2015-11-12 NOTE — Progress Notes (Signed)
Patient Care Team: Julie Fus, MD as PCP - General (Obstetrics and Gynecology)  DIAGNOSIS: Breast cancer of upper-inner quadrant of left female breast [facility])   Staging form: Breast, AJCC 7th Edition     Pathologic stage from 04/14/2014: Stage IA (T1b, N0, cM0) - Signed by Rulon Eisenmenger, MD on 05/15/2014       Staging comments: Staged from final excisional specimen report by Dr. Tresa Moore      Clinical: Stage IA (T1b(2), N0, M0) - Unsigned   SUMMARY OF ONCOLOGIC HISTORY:   Breast cancer of upper-inner quadrant of left female breast ([place])   02/25/2014 Initial Biopsy Left breast mass 11:00: Invasive ductal carcinoma grade 1, ER 100%, PR 99%, Ki-67 14%, HER-2 negative ratio 1.5   03/05/2014 Breast MRI Left breast 11:00, 10 x 7 x 7 mm, in addition 7 x 5 x 6 mm masslike area, negative lymph nodes   04/11/2014 Surgery Bilateral mastectomies: Left breast: Multifocal IDC 0.8 cm and 0.9 cm with DCIS 4 SLN negative, bilateral nipple biopsy is negative: Right breast mastectomy no malignancy 2 lymph nodes negative: ER/PR positive HER-2 negative Ki-67 14-16%    Procedure Genetic testing did not reveal any mutations   05/27/2014 -  Anti-estrogen oral therapy Tamoxifen 20 mg daily 10 years   06/17/2015 Surgery Breast reconstruction with latissimus dorsi muscle flap and an implant   10/20/2015 Surgery Hysterectomy with bilateral salpingo-oophorectomy    CHIEF COMPLIANT: Follow-up after hysterectomy and bilateral salpingo-oophorectomy, complains of fatigue  INTERVAL HISTORY: Julie [name] is a 50 year old lady with above-mentioned history of left breast cancer underwent bilateral mastectomies followed by reconstruction and was on tamoxifen therapy for his reduction of breast cancer. She was found to have abnormal uterine bleeding and she underwent hysterectomy with bilateral salpingectomy oophorectomy in May 2017. Since then she has had episodes of very severe emotional depression but finally she was able to  recover from this and appears to be doing quite well. She is currently on Effexor. When she had the uterine thickening, we discontinued her tamoxifen therapy. Patient's main complaint today is performed fatigue  REVIEW OF SYSTEMS:   Constitutional: Denies fevers, chills or abnormal weight loss Eyes: Denies blurriness of vision Ears, nose, mouth, throat, and face: Denies mucositis or sore throat Respiratory: Denies cough, dyspnea or wheezes Cardiovascular: Denies palpitation, chest discomfort Gastrointestinal:  Denies nausea, heartburn or change in bowel habits Skin: Denies abnormal skin rashes Lymphatics: Denies new lymphadenopathy or easy bruising Neurological:Denies numbness, tingling or new weaknesses Behavioral/Psych: Mood is stable, no new changes  Extremities: No lower extremity edema All other systems were reviewed with the patient and are negative.  I have reviewed the past medical history, past surgical history, social history and family history with the patient and they are unchanged from previous note.  ALLERGIES:  is allergic to codeine and hydrocodone.  MEDICATIONS:  Current Outpatient Prescriptions  Medication Sig Dispense Refill  . calcium carbonate (TUMS - DOSED IN MG ELEMENTAL CALCIUM) 500 MG chewable tablet Chew 1 tablet by mouth daily as needed for indigestion or heartburn.    . cetirizine-pseudoephedrine (ZYRTEC-D) 5-120 MG tablet Take 1 tablet by mouth daily as needed for allergies.    . diazepam (VALIUM) 5 MG tablet Take 1 tablet (5 mg total) by mouth every 8 (eight) hours as needed for anxiety or muscle spasms. 30 tablet 0  . FLUoxetine (PROZAC) 20 MG tablet Take 1 tablet (20 mg total) by mouth daily. 30 tablet 0  . loratadine (CLARITIN)  10 MG tablet Take 10 mg by mouth daily as needed for allergies.    Marland Kitchen omeprazole (PRILOSEC) 20 MG capsule Take 20 mg by mouth as needed.   3  . oxyCODONE-acetaminophen (PERCOCET/ROXICET) 5-325 MG tablet Take 1-2 tablets by mouth  every 4 (four) hours as needed for moderate pain. 50 tablet 0  . promethazine (PHENERGAN) 25 MG tablet Take 25 mg by mouth every 6 (six) hours as needed for nausea or vomiting.    . sulfamethoxazole-trimethoprim (BACTRIM DS,SEPTRA DS) 800-160 MG tablet Take 1 tablet by mouth 2 (two) times daily. 10 tablet 0   No current facility-administered medications for this visit.    PHYSICAL EXAMINATION: ECOG PERFORMANCE STATUS: 1 - Symptomatic but completely ambulatory  Filed Vitals:   11/12/15 1525  BP: 128/88  Pulse: 72  Temp: 98.8 F (37.1 C)  Resp: 18   Filed Weights   11/12/15 1525  Weight: 129 lb 11.2 oz (58.832 kg)    GENERAL:alert, no distress and comfortable SKIN: skin color, texture, turgor are normal, no rashes or significant lesions EYES: normal, Conjunctiva are pink and non-injected, sclera clear OROPHARYNX:no exudate, no erythema and lips, buccal mucosa, and tongue normal  NECK: supple, thyroid normal size, non-tender, without nodularity LYMPH:  no palpable lymphadenopathy in the cervical, axillary or inguinal LUNGS: clear to auscultation and percussion with normal breathing effort HEART: regular rate & rhythm and no murmurs and no lower extremity edema ABDOMEN:abdomen soft, non-tender and normal bowel sounds MUSCULOSKELETAL:no cyanosis of digits and no clubbing  NEURO: alert & oriented x 3 with fluent speech, no focal motor/sensory deficits EXTREMITIES: No lower extremity edema  LABORATORY DATA:  I have reviewed the data as listed   Chemistry      Component Value Date/Time   NA 138 06/12/2015 1522   NA 139 02/12/2015 1410   K 3.9 06/12/2015 1522   K 3.8 02/12/2015 1410   CL 106 06/12/2015 1522   CO2 24 06/12/2015 1522   CO2 24 02/12/2015 1410   BUN 8 06/12/2015 1522   BUN 8.0 02/12/2015 1410   CREATININE 0.71 06/12/2015 1522   CREATININE 0.8 02/12/2015 1410      Component Value Date/Time   CALCIUM 8.6* 06/12/2015 1522   CALCIUM 9.0 02/12/2015 1410    ALKPHOS 61 02/12/2015 1410   AST 19 02/12/2015 1410   ALT 14 02/12/2015 1410   BILITOT 0.63 02/12/2015 1410       Lab Results  Component Value Date   WBC 6.5 06/12/2015   HGB 11.9* 06/12/2015   HCT 37.7 06/12/2015   MCV 84.9 06/12/2015   PLT 184 06/12/2015   NEUTROABS 3.7 06/12/2015     ASSESSMENT & PLAN:  Breast cancer of upper-inner quadrant of left female breast Left breast multifocal invasive ductal carcinoma 0.8 cm and 0.9 cm, ER/PR positive HER-2 negative (ratio is 0.97 and 1.23) Ki-67 ranged 14-16% status post bilateral mastectomies 4 SLN negative.Oncotype DX recurrence score 9, 7 percent risk of recurrence; currently on tamoxifen X10 years Started 05/26/2015 Due to infections, the breast expanders had to be removed.  Patient had undergone breast reconstruction with latissimus dorsi flap in January 2017. Patient underwent hysterectomy with bilateral salpingo-oophorectomy on 10/17/2015 (discontinued tamoxifen due to uterine thickening)  Breast cancer surveillance: 1. Chest and axillary exams every 6 months: Breast exam 08/11/2015 is without any concerns 2. No role of imaging because she had bilateral mastectomies  B-12 deficiency: I will start her on B-12 injections monthly 6. With the hope that replacing  B-12 will improve her energy levels.  Return to clinic in 3 months for follow-up to determine if she would want to go on another antiestrogen therapy with anastrozole. Patient stops were that she had oophorectomy and bilateral mastectomy, she is not very keen on taking any further antiestrogen treatment. However if she were to felt better physically and emotionally she may consider taking that in 3 months when she returns back to see me.  No orders of the defined types were placed in this encounter.   The patient has a good understanding of the overall plan. she agrees with it. she will call with any problems that may develop before the next visit here.   Rulon Eisenmenger, MD 11/12/2015

## 2015-11-12 NOTE — Assessment & Plan Note (Addendum)
Left breast multifocal invasive ductal carcinoma 0.8 cm and 0.9 cm, ER/PR positive HER-2 negative (ratio is 0.97 and 1.23) Ki-67 ranged 14-16% status post bilateral mastectomies 4 SLN negative.Oncotype DX recurrence score 9, 7 percent risk of recurrence; currently on tamoxifen X10 years Started 05/26/2015 Due to infections, the breast expanders had to be removed.  Patient had undergone breast reconstruction with latissimus dorsi flap in January 2017. Patient underwent hysterectomy with bilateral salpingo-oophorectomy on 10/17/2015 (discontinued tamoxifen due to uterine thickening)  Breast cancer surveillance: 1. Chest and axillary exams every 6 months: Breast exam 08/11/2015 is without any concerns 2. No role of imaging because she had bilateral mastectomies  B-12 deficiency: I will start her on B-12 injections monthly 6. With the hope that replacing B-12 will improve her energy levels.  Return to clinic in 3 months for follow-up to determine if she would want to go on another antiestrogen therapy with anastrozole. Patient stops were that she had oophorectomy and bilateral mastectomy, she is not very keen on taking any further antiestrogen treatment. However if she were to felt better physically and emotionally she may consider taking that in 3 months when she returns back to see me.

## 2015-11-12 NOTE — Telephone Encounter (Signed)
appt made and avs printed °

## 2015-11-18 ENCOUNTER — Ambulatory Visit (HOSPITAL_BASED_OUTPATIENT_CLINIC_OR_DEPARTMENT_OTHER): Payer: BLUE CROSS/BLUE SHIELD

## 2015-11-18 DIAGNOSIS — E538 Deficiency of other specified B group vitamins: Secondary | ICD-10-CM

## 2015-11-18 MED ORDER — CYANOCOBALAMIN 1000 MCG/ML IJ SOLN
1000.0000 ug | Freq: Once | INTRAMUSCULAR | Status: AC
  Administered 2015-11-18: 1000 ug via INTRAMUSCULAR

## 2015-12-16 ENCOUNTER — Ambulatory Visit (HOSPITAL_BASED_OUTPATIENT_CLINIC_OR_DEPARTMENT_OTHER): Payer: BLUE CROSS/BLUE SHIELD

## 2015-12-16 VITALS — BP 116/82 | HR 82 | Temp 97.8°F | Resp 20

## 2015-12-16 DIAGNOSIS — E538 Deficiency of other specified B group vitamins: Secondary | ICD-10-CM

## 2015-12-16 MED ORDER — CYANOCOBALAMIN 1000 MCG/ML IJ SOLN
1000.0000 ug | Freq: Once | INTRAMUSCULAR | Status: AC
  Administered 2015-12-16: 1000 ug via INTRAMUSCULAR

## 2015-12-16 NOTE — Patient Instructions (Signed)

## 2015-12-25 ENCOUNTER — Encounter: Payer: Self-pay | Admitting: *Deleted

## 2015-12-25 NOTE — Progress Notes (Signed)
Julie [name]  Clinical Social [name] was referred by patient for assessment of psychosocial needs, counseling resources.  Clinical Social Worker contacted patient at home to offer support and assess for needs.  Pt completed surgery to treat her breast cancer and is experiencing common anxieties in survivorship. CSW strongly encouraged pt to consider Breast Cancer Support group and FYNN class. She is very interested in both and CSW mailing her information today. Pt aware to reach out as needed.     Clinical Social [name] interventions: Resource education  Julie [name], [name] [name]  [place] Phone: [phone] Fax: [phone]

## 2016-01-13 ENCOUNTER — Ambulatory Visit (HOSPITAL_BASED_OUTPATIENT_CLINIC_OR_DEPARTMENT_OTHER): Payer: BLUE CROSS/BLUE SHIELD

## 2016-01-13 VITALS — BP 130/87 | HR 70 | Temp 98.0°F | Resp 18

## 2016-01-13 DIAGNOSIS — E538 Deficiency of other specified B group vitamins: Secondary | ICD-10-CM

## 2016-01-13 MED ORDER — CYANOCOBALAMIN 1000 MCG/ML IJ SOLN
1000.0000 ug | Freq: Once | INTRAMUSCULAR | Status: AC
  Administered 2016-01-13: 1000 ug via INTRAMUSCULAR

## 2016-01-13 NOTE — Patient Instructions (Signed)

## 2016-02-10 ENCOUNTER — Ambulatory Visit (HOSPITAL_BASED_OUTPATIENT_CLINIC_OR_DEPARTMENT_OTHER): Payer: BLUE CROSS/BLUE SHIELD | Admitting: Hematology and Oncology

## 2016-02-10 ENCOUNTER — Encounter: Payer: Self-pay | Admitting: Hematology and Oncology

## 2016-02-10 ENCOUNTER — Telehealth: Payer: Self-pay | Admitting: Hematology and Oncology

## 2016-02-10 ENCOUNTER — Ambulatory Visit (HOSPITAL_BASED_OUTPATIENT_CLINIC_OR_DEPARTMENT_OTHER): Payer: BLUE CROSS/BLUE SHIELD

## 2016-02-10 DIAGNOSIS — C50212 Malignant neoplasm of upper-inner quadrant of left female breast: Secondary | ICD-10-CM

## 2016-02-10 DIAGNOSIS — E538 Deficiency of other specified B group vitamins: Secondary | ICD-10-CM

## 2016-02-10 MED ORDER — CYANOCOBALAMIN 1000 MCG/ML IJ SOLN
1000.0000 ug | Freq: Once | INTRAMUSCULAR | Status: AC
  Administered 2016-02-10: 1000 ug via INTRAMUSCULAR

## 2016-02-10 MED ORDER — TAMOXIFEN CITRATE 20 MG PO TABS: 20.0000 mg | ORAL_TABLET | Freq: Every day | ORAL | 3 refills | Status: DC

## 2016-02-10 NOTE — Progress Notes (Signed)
Patient Care Team: Maisie Fus, MD as PCP - General (Obstetrics and Gynecology)  DIAGNOSIS: Breast cancer of upper-inner quadrant of left female breast [facility])   Staging form: Breast, AJCC 7th Edition   - Pathologic stage from 04/14/2014: Stage IA (T1b, N0, cM0) - Signed by Rulon Eisenmenger, MD on 05/15/2014         Staging comments: Staged from final excisional specimen report by Dr. Tresa Moore   - Clinical: Stage IA (T1b(2), N0, M0) - Unsigned  SUMMARY OF ONCOLOGIC HISTORY:   Breast cancer of upper-inner quadrant of left female breast ([place])   02/25/2014 Initial Biopsy    Left breast mass 11:00: Invasive ductal carcinoma grade 1, ER 100%, PR 99%, Ki-67 14%, HER-2 negative ratio 1.5      03/05/2014 Breast MRI    Left breast 11:00, 10 x 7 x 7 mm, in addition 7 x 5 x 6 mm masslike area, negative lymph nodes      04/11/2014 Surgery    Bilateral mastectomies: Left breast: Multifocal IDC 0.8 cm and 0.9 cm with DCIS 4 SLN negative, bilateral nipple biopsy is negative: Right breast mastectomy no malignancy 2 lymph nodes negative: ER/PR positive HER-2 negative Ki-67 14-16%       Procedure    Genetic testing did not reveal any mutations      05/27/2014 -  Anti-estrogen oral therapy    Tamoxifen 20 mg daily 10 years      06/17/2015 Surgery    Breast reconstruction with latissimus dorsi muscle flap and an implant      10/20/2015 Surgery    Hysterectomy with bilateral salpingo-oophorectomy       CHIEF COMPLIANT: follow-up of breast cancer, complaining of low back pain, decreased appetite  INTERVAL HISTORY: Julie [name] is a 50 year old with above-mentioned history of bilateral mastectomies and a prior left breast cancer. She was previously taking tamoxifen but that led to uterine hypertrophy. Since then she underwent hysterectomy and salpingectomy oophorectomy. Subsequently she had profound fatigue issues and did not want to continue antiestrogen therapy. We prescribed her B 12 injections  and she done extremely well from it with improvement in energy levels with B 12. Now she presents to [place] to discuss whether we need to resume antiestrogen therapy. Patient is extremely anxious about recurrence of breast cancer. She is complaining of low back pain which is new and intense and sometimes she has neurological symptoms down her legs as well.  REVIEW OF SYSTEMS:   Constitutional: Denies fevers, chills or abnormal weight loss Eyes: Denies blurriness of vision Ears, nose, mouth, throat, and face: Denies mucositis or sore throat Respiratory: Denies cough, dyspnea or wheezes Cardiovascular: Denies palpitation, chest discomfort Gastrointestinal:  Denies nausea, heartburn or change in bowel habits Skin: Denies abnormal skin rashes Lymphatics: Denies new lymphadenopathy or easy bruising Neurological:low back pain with occasional shooting pains down the legs Behavioral/Psych: Mood is stable, no new changes  Extremities: No lower extremity edema Breast:  denies any pain or lumps or nodules in either breasts All other systems were reviewed with the patient and are negative.  I have reviewed the past medical history, past surgical history, social history and family history with the patient and they are unchanged from previous note.  ALLERGIES:  is allergic to codeine and hydrocodone.  MEDICATIONS:  Current Outpatient Prescriptions  Medication Sig Dispense Refill  . calcium carbonate (TUMS - DOSED IN MG ELEMENTAL CALCIUM) 500 MG chewable tablet Chew 1 tablet by mouth daily as needed for  indigestion or heartburn.    . cetirizine-pseudoephedrine (ZYRTEC-D) 5-120 MG tablet Take 1 tablet by mouth daily as needed for allergies.    . diazepam (VALIUM) 5 MG tablet Take 1 tablet (5 mg total) by mouth every 8 (eight) hours as needed for anxiety or muscle spasms. 30 tablet 0  . FLUoxetine (PROZAC) 20 MG tablet Take 1 tablet (20 mg total) by mouth daily. 30 tablet 0  . loratadine (CLARITIN) 10 MG  tablet Take 10 mg by mouth daily as needed for allergies.    Marland Kitchen omeprazole (PRILOSEC) 20 MG capsule Take 20 mg by mouth as needed.   3  . oxyCODONE-acetaminophen (PERCOCET/ROXICET) 5-325 MG tablet Take 1-2 tablets by mouth every 4 (four) hours as needed for moderate pain. 50 tablet 0  . promethazine (PHENERGAN) 25 MG tablet Take 25 mg by mouth every 6 (six) hours as needed for nausea or vomiting.    . sulfamethoxazole-trimethoprim (BACTRIM DS,SEPTRA DS) 800-160 MG tablet Take 1 tablet by mouth 2 (two) times daily. 10 tablet 0  . tamoxifen (NOLVADEX) 20 MG tablet Take 1 tablet (20 mg total) by mouth daily. 90 tablet 3   No current facility-administered medications for this visit.     PHYSICAL EXAMINATION: ECOG PERFORMANCE STATUS: 1 - Symptomatic but completely ambulatory  Vitals:   02/10/16 0942  BP: 120/86  Pulse: 70  Resp: 18  Temp: 97.7 F (36.5 C)   Filed Weights   02/10/16 0942  Weight: 128 lb 8 oz (58.3 kg)    GENERAL:alert, no distress and comfortable SKIN: skin color, texture, turgor are normal, no rashes or significant lesions EYES: normal, Conjunctiva are pink and non-injected, sclera clear OROPHARYNX:no exudate, no erythema and lips, buccal mucosa, and tongue normal  NECK: supple, thyroid normal size, non-tender, without nodularity LYMPH:  no palpable lymphadenopathy in the cervical, axillary or inguinal LUNGS: clear to auscultation and percussion with normal breathing effort HEART: regular rate & rhythm and no murmurs and no lower extremity edema ABDOMEN:abdomen soft, non-tender and normal bowel sounds MUSCULOSKELETAL:no cyanosis of digits and no clubbing  NEURO: alert & oriented x 3 with fluent speech, no focal motor/sensory deficits EXTREMITIES: No lower extremity edema  LABORATORY DATA:  I have reviewed the data as listed   Chemistry      Component Value Date/Time   NA 138 06/12/2015 1522   NA 139 02/12/2015 1410   K 3.9 06/12/2015 1522   K 3.8 02/12/2015  1410   CL 106 06/12/2015 1522   CO2 24 06/12/2015 1522   CO2 24 02/12/2015 1410   BUN 8 06/12/2015 1522   BUN 8.0 02/12/2015 1410   CREATININE 0.71 06/12/2015 1522   CREATININE 0.8 02/12/2015 1410      Component Value Date/Time   CALCIUM 8.6 (L) 06/12/2015 1522   CALCIUM 9.0 02/12/2015 1410   ALKPHOS 61 02/12/2015 1410   AST 19 02/12/2015 1410   ALT 14 02/12/2015 1410   BILITOT 0.63 02/12/2015 1410     Lab Results  Component Value Date   WBC 6.5 06/12/2015   HGB 11.9 (L) 06/12/2015   HCT 37.7 06/12/2015   MCV 84.9 06/12/2015   PLT 184 06/12/2015   NEUTROABS 3.7 06/12/2015   ASSESSMENT & PLAN:  Breast cancer of upper-inner quadrant of left female breast Left breast multifocal invasive ductal carcinoma 0.8 cm and 0.9 cm, ER/PR positive HER-2 negative (ratio is 0.97 and 1.23) Ki-67 ranged 14-16% status post bilateral mastectomies 4 SLN negative.Oncotype DX recurrence score 9, 7 percent  risk of recurrence; currently on tamoxifen X10 years Started 05/26/2015 Due to infections, the breast expanders had to be removed.  Patient had undergone breast reconstruction with latissimus dorsi flap in January 2017. Patient underwent hysterectomy with bilateral salpingo-oophorectomy on 10/17/2015 (discontinued tamoxifen due to uterine thickening)  Breast cancer surveillance: 1. Chest and axillary exams every 6 months: Breast exam 08/11/2015 is without any concerns 2. No role of imaging because she had bilateral mastectomies  Low back pain: I would like to obtain a CT of her chest abdomen pelvis and a bone scan for staging evaluation because she has profound loss of appetite and back pain radiating down the legs. I will call her with the results of the scans.  B-12 deficiency: currently on B-12 injections. plan to continue with B 12 injections since they have improved her energy levels.  I recommended that she resume antiestrogen therapy with tamoxifen. She had tolerated tamoxifen fairly  well so we like to stick to it.If she has any issues with tolerability then we can consider switching her to aromatase inhibitor therapy.  Return to clinic in 6 months for follow-up   Orders Placed This Encounter  Procedures  . CT Chest W Contrast    Standing Status:   Future    Standing Expiration Date:   02/09/2017    Order Specific Question:   If indicated for the ordered procedure, I authorize the administration of contrast media per Radiology protocol    Answer:   Yes    Order Specific Question:   Reason for Exam (SYMPTOM  OR DIAGNOSIS REQUIRED)    Answer:   back pain with breast cancer    Order Specific Question:   Is patient pregnant?    Answer:   No    Order Specific Question:   Preferred imaging location?    Answer:   [facility]  . CT Abdomen Pelvis W Contrast    Standing Status:   Future    Standing Expiration Date:   02/09/2017    Order Specific Question:   If indicated for the ordered procedure, I authorize the administration of contrast media per Radiology protocol    Answer:   Yes    Order Specific Question:   Reason for Exam (SYMPTOM  OR DIAGNOSIS REQUIRED)    Answer:   Low back pain with breast cancer    Order Specific Question:   Is patient pregnant?    Answer:   No    Order Specific Question:   Preferred imaging location?    Answer:   [facility]    Standing Status:   Future    Standing Expiration Date:   02/09/2017    Order Specific Question:   Reason for Exam (SYMPTOM  OR DIAGNOSIS REQUIRED)    Answer:   Back pain with breast cancer    Order Specific Question:   Is the patient pregnant?    Answer:   No    Order Specific Question:   Preferred imaging location?    Answer:   [facility]    Order Specific Question:   If indicated for the ordered procedure, I authorize the administration of a radiopharmaceutical per Radiology protocol    Answer:   Yes   The patient has a good understanding of the overall plan.  she agrees with it. she will call with any problems that may develop before the next visit here.   Rulon Eisenmenger, MD 02/10/16

## 2016-02-10 NOTE — Patient Instructions (Signed)

## 2016-02-10 NOTE — Telephone Encounter (Signed)
appt made and avs printed °

## 2016-02-10 NOTE — Assessment & Plan Note (Signed)
Left breast multifocal invasive ductal carcinoma 0.8 cm and 0.9 cm, ER/PR positive HER-2 negative (ratio is 0.97 and 1.23) Ki-67 ranged 14-16% status post bilateral mastectomies 4 SLN negative.Oncotype DX recurrence score 9, 7 percent risk of recurrence; currently on tamoxifen X10 years Started 05/26/2015 Due to infections, the breast expanders had to be removed.  Patient had undergone breast reconstruction with latissimus dorsi flap in January 2017. Patient underwent hysterectomy with bilateral salpingo-oophorectomy on 10/17/2015 (discontinued tamoxifen due to uterine thickening)  Breast cancer surveillance: 1. Chest and axillary exams every 6 months: Breast exam 08/11/2015 is without any concerns 2. No role of imaging because she had bilateral mastectomies  B-12 deficiency: I will start her on B-12 injections monthly 6. With the hope that replacing B-12 will improve her energy levels.  Discussion regarding further antiestrogen therapy: Patient is eligible to receive anastrozole. The other option would be to continue with the tamoxifen. She decided to previously hold off on taking the medication until she is physically and emotionally better.  Return to clinic in 6 months for follow-up

## 2016-02-24 ENCOUNTER — Ambulatory Visit (HOSPITAL_COMMUNITY)
Admission: RE | Admit: 2016-02-24 | Discharge: 2016-02-24 | Disposition: A | Discharge status: Home / Self Care | Payer: BLUE CROSS/BLUE SHIELD | Source: Ambulatory Visit | Attending: Hematology and Oncology | Admitting: Hematology and Oncology

## 2016-02-24 ENCOUNTER — Encounter (HOSPITAL_COMMUNITY): Payer: Self-pay | Admitting: Radiology

## 2016-02-24 DIAGNOSIS — K5730 Diverticulosis of large intestine without perforation or abscess without bleeding: Secondary | ICD-10-CM | POA: Insufficient documentation

## 2016-02-24 DIAGNOSIS — R911 Solitary pulmonary nodule: Secondary | ICD-10-CM | POA: Insufficient documentation

## 2016-02-24 DIAGNOSIS — I700 Atherosclerosis of aorta: Secondary | ICD-10-CM | POA: Diagnosis not present

## 2016-02-24 DIAGNOSIS — C50212 Malignant neoplasm of upper-inner quadrant of left female breast: Secondary | ICD-10-CM

## 2016-02-24 DIAGNOSIS — R932 Abnormal findings on diagnostic imaging of liver and biliary tract: Secondary | ICD-10-CM | POA: Insufficient documentation

## 2016-02-24 MED ORDER — IOPAMIDOL (ISOVUE-300) INJECTION 61%
100.0000 mL | Freq: Once | INTRAVENOUS | Status: AC | PRN
  Administered 2016-02-24: 100 mL via INTRAVENOUS

## 2016-02-25 ENCOUNTER — Telehealth: Payer: Self-pay | Admitting: *Deleted

## 2016-02-25 NOTE — Telephone Encounter (Signed)
Patient called and would like CT scan results. Return number is [phone].

## 2016-02-26 ENCOUNTER — Encounter (HOSPITAL_COMMUNITY)
Admission: RE | Admit: 2016-02-26 | Discharge: 2016-02-26 | Disposition: A | Discharge status: Home / Self Care | Payer: BLUE CROSS/BLUE SHIELD | Source: Ambulatory Visit | Attending: Hematology and Oncology | Admitting: Hematology and Oncology

## 2016-02-26 ENCOUNTER — Ambulatory Visit (HOSPITAL_COMMUNITY)
Admission: RE | Admit: 2016-02-26 | Discharge: 2016-02-26 | Disposition: A | Discharge status: Home / Self Care | Payer: BLUE CROSS/BLUE SHIELD | Source: Ambulatory Visit | Attending: Hematology and Oncology | Admitting: Hematology and Oncology

## 2016-02-26 ENCOUNTER — Other Ambulatory Visit: Payer: Self-pay

## 2016-02-26 DIAGNOSIS — M545 Low back pain: Secondary | ICD-10-CM | POA: Insufficient documentation

## 2016-02-26 DIAGNOSIS — C50212 Malignant neoplasm of upper-inner quadrant of left female breast: Secondary | ICD-10-CM | POA: Diagnosis not present

## 2016-02-26 MED ORDER — TECHNETIUM TC 99M MEDRONATE IV KIT
25.0000 | PACK | Freq: Once | INTRAVENOUS | Status: AC | PRN
  Administered 2016-02-26: 20.2 via INTRAVENOUS

## 2016-02-26 NOTE — Telephone Encounter (Signed)
Error

## 2016-02-26 NOTE — Progress Notes (Signed)
Per  Dr. Lindi Adie, I let the patient know he is confident the spot on her liver is benign.  He will have MRI done for certainty.  The spot on her lung is benign.    I let pt know MRI has been ordered to be done by Wed.  Pt to call clinic if she has not heard from scheduling by [place] Monday.    I let pt know bone scan results are not available yet.    Pt voiced understanding.    MRI orders placed

## 2016-02-29 ENCOUNTER — Encounter: Payer: Self-pay | Admitting: Hematology and Oncology

## 2016-02-29 ENCOUNTER — Ambulatory Visit (HOSPITAL_BASED_OUTPATIENT_CLINIC_OR_DEPARTMENT_OTHER): Payer: BLUE CROSS/BLUE SHIELD | Admitting: Hematology and Oncology

## 2016-02-29 VITALS — BP 116/85 | HR 93 | Temp 98.7°F | Resp 18 | Wt 128.9 lb

## 2016-02-29 DIAGNOSIS — E538 Deficiency of other specified B group vitamins: Secondary | ICD-10-CM

## 2016-02-29 DIAGNOSIS — F429 Obsessive-compulsive disorder, unspecified: Secondary | ICD-10-CM

## 2016-02-29 DIAGNOSIS — R911 Solitary pulmonary nodule: Secondary | ICD-10-CM | POA: Diagnosis not present

## 2016-02-29 DIAGNOSIS — C50212 Malignant neoplasm of upper-inner quadrant of left female breast: Secondary | ICD-10-CM

## 2016-02-29 MED ORDER — ANASTROZOLE 1 MG PO TABS: 1.0000 mg | ORAL_TABLET | Freq: Every day | ORAL | 3 refills | Status: DC

## 2016-02-29 NOTE — Progress Notes (Signed)
Patient Care Team: Maisie Fus, MD as PCP - General (Obstetrics and Gynecology)  DIAGNOSIS: Breast cancer of upper-inner quadrant of left female breast [facility])   Staging form: Breast, AJCC 7th Edition   - Pathologic stage from 04/14/2014: Stage IA (T1b, N0, cM0) - Signed by Rulon Eisenmenger, MD on 05/15/2014         Staging comments: Staged from final excisional specimen report by Dr. Tresa Moore   - Clinical: Stage IA (T1b(2), N0, M0) - Unsigned  SUMMARY OF ONCOLOGIC HISTORY:   Breast cancer of upper-inner quadrant of left female breast ([place])   02/25/2014 Initial Biopsy    Left breast mass 11:00: Invasive ductal carcinoma grade 1, ER 100%, PR 99%, Ki-67 14%, HER-2 negative ratio 1.5      03/05/2014 Breast MRI    Left breast 11:00, 10 x 7 x 7 mm, in addition 7 x 5 x 6 mm masslike area, negative lymph nodes      04/11/2014 Surgery    Bilateral mastectomies: Left breast: Multifocal IDC 0.8 cm and 0.9 cm with DCIS 4 SLN negative, bilateral nipple biopsy is negative: Right breast mastectomy no malignancy 2 lymph nodes negative: ER/PR positive HER-2 negative Ki-67 14-16%       Procedure    Genetic testing did not reveal any mutations      05/27/2014 -  Anti-estrogen oral therapy    Tamoxifen 20 mg daily 10 years      06/17/2015 Surgery    Breast reconstruction with latissimus dorsi muscle flap and an implant      10/20/2015 Surgery    Hysterectomy with bilateral salpingo-oophorectomy      02/25/2016 Imaging    CT Abd/Pelvis: No evidence of metastatic disease. With a 3 cm focus right liver suggestive of benign finding liver MRI recommended. 6 cm solitary right lower lobe lung nodule favor benign recommend CT follow-up in 6 months, bone scan negative       CHIEF COMPLIANT: Follow-up to discuss his CT scans and bone scans  INTERVAL HISTORY: Julie [name] is a 50 year old with above-mentioned history of left breast cancer treated with bilateral mastectomies. She also had hysterectomy  and bilateral salpingo-oophorectomy. She is currently on tamoxifen therapy since December 2015. She was having lots of back pain and we obtain CT scans and bone scans. Scans reveal that she has small thyroid nodules as well as 6 mm lung nodule in addition to a 3 cm vague hypodensity in the liver and she is here today to discuss what additional testing may be required for them. She reports that that tamoxifen has been causing hot flashes. She is also bothered by the poor control of her obsessive-compulsive disorder on Effexor. He thought Prozac was so much more better. She wishes to know what alternatives are there to tamoxifen.  REVIEW OF SYSTEMS:   Constitutional: Denies fevers, chills or abnormal weight loss Eyes: Denies blurriness of vision Ears, nose, mouth, throat, and face: Denies mucositis or sore throat Respiratory: Denies cough, dyspnea or wheezes Cardiovascular: Denies palpitation, chest discomfort Gastrointestinal:  Denies nausea, heartburn or change in bowel habits Skin: Denies abnormal skin rashes Lymphatics: Denies new lymphadenopathy or easy bruising Neurological:Denies numbness, tingling or new weaknesses, chronic back pain Behavioral/Psych: Mood is stable, no new changes  Extremities: No lower extremity edema Breast: Bilateral mastectomies All other systems were reviewed with the patient and are negative.  I have reviewed the past medical history, past surgical history, social history and family history with the patient  and they are unchanged from previous note.  ALLERGIES:  is allergic to codeine and hydrocodone.  MEDICATIONS:  Current Outpatient Prescriptions  Medication Sig Dispense Refill  . calcium carbonate (TUMS - DOSED IN MG ELEMENTAL CALCIUM) 500 MG chewable tablet Chew 1 tablet by mouth daily as needed for indigestion or heartburn.    . cetirizine-pseudoephedrine (ZYRTEC-D) 5-120 MG tablet Take 1 tablet by mouth daily as needed for allergies.    . diazepam  (VALIUM) 5 MG tablet Take 1 tablet (5 mg total) by mouth every 8 (eight) hours as needed for anxiety or muscle spasms. 30 tablet 0  . FLUoxetine (PROZAC) 20 MG tablet Take 1 tablet (20 mg total) by mouth daily. 30 tablet 0  . loratadine (CLARITIN) 10 MG tablet Take 10 mg by mouth daily as needed for allergies.    Marland Kitchen omeprazole (PRILOSEC) 20 MG capsule Take 20 mg by mouth as needed.   3  . oxyCODONE-acetaminophen (PERCOCET/ROXICET) 5-325 MG tablet Take 1-2 tablets by mouth every 4 (four) hours as needed for moderate pain. 50 tablet 0  . promethazine (PHENERGAN) 25 MG tablet Take 25 mg by mouth every 6 (six) hours as needed for nausea or vomiting.    . sulfamethoxazole-trimethoprim (BACTRIM DS,SEPTRA DS) 800-160 MG tablet Take 1 tablet by mouth 2 (two) times daily. 10 tablet 0  . tamoxifen (NOLVADEX) 20 MG tablet Take 1 tablet (20 mg total) by mouth daily. 90 tablet 3   No current facility-administered medications for this visit.     PHYSICAL EXAMINATION: ECOG PERFORMANCE STATUS: 1 - Symptomatic but completely ambulatory  Vitals:   02/29/16 1544  BP: 116/85  Pulse: 93  Resp: 18  Temp: 98.7 F (37.1 C)   Filed Weights   02/29/16 1544  Weight: 128 lb 14.4 oz (58.5 kg)    GENERAL:alert, no distress and comfortable SKIN: skin color, texture, turgor are normal, no rashes or significant lesions EYES: normal, Conjunctiva are pink and non-injected, sclera clear OROPHARYNX:no exudate, no erythema and lips, buccal mucosa, and tongue normal  NECK: supple, thyroid normal size, non-tender, without nodularity LYMPH:  no palpable lymphadenopathy in the cervical, axillary or inguinal LUNGS: clear to auscultation and percussion with normal breathing effort HEART: regular rate & rhythm and no murmurs and no lower extremity edema ABDOMEN:abdomen soft, non-tender and normal bowel sounds MUSCULOSKELETAL:no cyanosis of digits and no clubbing  NEURO: alert & oriented x 3 with fluent speech, no focal  motor/sensory deficits EXTREMITIES: No lower extremity edema  LABORATORY DATA:  I have reviewed the data as listed   Chemistry      Component Value Date/Time   NA 138 06/12/2015 1522   NA 139 02/12/2015 1410   K 3.9 06/12/2015 1522   K 3.8 02/12/2015 1410   CL 106 06/12/2015 1522   CO2 24 06/12/2015 1522   CO2 24 02/12/2015 1410   BUN 8 06/12/2015 1522   BUN 8.0 02/12/2015 1410   CREATININE 0.71 06/12/2015 1522   CREATININE 0.8 02/12/2015 1410      Component Value Date/Time   CALCIUM 8.6 (L) 06/12/2015 1522   CALCIUM 9.0 02/12/2015 1410   ALKPHOS 61 02/12/2015 1410   AST 19 02/12/2015 1410   ALT 14 02/12/2015 1410   BILITOT 0.63 02/12/2015 1410       Lab Results  Component Value Date   WBC 6.5 06/12/2015   HGB 11.9 (L) 06/12/2015   HCT 37.7 06/12/2015   MCV 84.9 06/12/2015   PLT 184 06/12/2015   NEUTROABS  3.7 06/12/2015     ASSESSMENT & PLAN:  Breast cancer of upper-inner quadrant of left female breast Left breast multifocal invasive ductal carcinoma 0.8 cm and 0.9 cm, ER/PR positive HER-2 negative (ratio is 0.97 and 1.23) Ki-67 ranged 14-16% status post bilateral mastectomies 4 SLN negative.Oncotype DX recurrence score 9, 7 percent risk of recurrence; currently on tamoxifen X10 years Started 05/26/2015 Due to infections, the breast expanders had to be removed.  Patient had undergone breast reconstruction with latissimus dorsi flap in January 2017. Patient underwent hysterectomy with bilateral salpingo-oophorectomy on 10/17/2015 (discontinued tamoxifen due to uterine thickening)  Breast cancer surveillance: 1. Chest and axillary exams every 6 months: Breast exam 02/29/2016 is without any concerns 2. No role of imaging because she had bilateral mastectomies  B-12 deficiency: currently on B-12 injections. plan to continue with B 12 injections since they have improved her energy levels.   02/25/2016 CT Abd/Pelvis: No evidence of metastatic disease. With a 3 cm  focus right liver suggestive of benign finding liver MRI recommended. 6 cm solitary right lower lobe lung nodule favor benign recommend CT follow-up in 6 months, bone scan negative. I reviewed the scans in detail with her. Based on the radiology recommendation, we are obtaining an MRI of the liver and will plan on obtaining a CT chest in 6 months. I will call her with the result of the liver MRI. Return to clinic in 3 months for follow-up after performing CT chest.  Antiestrogen therapy: Patient would like to switch from Effexor to Prozac. Since Prozac and tamoxifen have been in traction, I recommended switching her to anastrozole. I sent a new prescription for anastrozole. She will taper off and discontinue Effexor and will take Prozac instead. Her husband reports that on Effexor she has felt remarkable increase in the obsessive-compulsiveness and the stress and anxiety related to it.   No orders of the defined types were placed in this encounter.  The patient has a good understanding of the overall plan. she agrees with it. she will call with any problems that may develop before the next visit here.   Rulon Eisenmenger, MD 02/29/16

## 2016-02-29 NOTE — Assessment & Plan Note (Signed)
Left breast multifocal invasive ductal carcinoma 0.8 cm and 0.9 cm, ER/PR positive HER-2 negative (ratio is 0.97 and 1.23) Ki-67 ranged 14-16% status post bilateral mastectomies 4 SLN negative.Oncotype DX recurrence score 9, 7 percent risk of recurrence; currently on tamoxifen X10 years Started 05/26/2015 Due to infections, the breast expanders had to be removed.  Patient had undergone breast reconstruction with latissimus dorsi flap in January 2017. Patient underwent hysterectomy with bilateral salpingo-oophorectomy on 10/17/2015 (discontinued tamoxifen due to uterine thickening)  Breast cancer surveillance: 1. Chest and axillary exams every 6 months: Breast exam 02/29/2016 is without any concerns 2. No role of imaging because she had bilateral mastectomies  Low back pain: I would like to obtain a CT of her chest abdomen pelvis and a bone scan for staging evaluation because she has profound loss of appetite and back pain radiating down the legs. I will call her with the results of the scans.  B-12 deficiency: currently on B-12 injections. plan to continue with B 12 injections since they have improved her energy levels.  I recommended that she resume antiestrogen therapy with tamoxifen. She had tolerated tamoxifen fairly well so we like to stick to it.If she has any issues with tolerability then we can consider switching her to aromatase inhibitor therapy.  02/25/2016 CT Abd/Pelvis: No evidence of metastatic disease. With a 3 cm focus right liver suggestive of benign finding liver MRI recommended. 6 cm solitary right lower lobe lung nodule favor benign recommend CT follow-up in 6 months, bone scan negative. I reviewed the scans in detail with her. Based on the radiology recommendation, we are obtaining an MRI of the liver and will plan on obtaining a CT chest in 6 months. I will call her with the result of the liver MRI. Return to clinic in 6 months for follow-up after performing CT  chest.

## 2016-03-09 ENCOUNTER — Telehealth: Payer: Self-pay | Admitting: Hematology and Oncology

## 2016-03-09 ENCOUNTER — Ambulatory Visit: Payer: BLUE CROSS/BLUE SHIELD

## 2016-03-09 ENCOUNTER — Ambulatory Visit (HOSPITAL_COMMUNITY): Payer: BLUE CROSS/BLUE SHIELD

## 2016-03-09 ENCOUNTER — Ambulatory Visit (HOSPITAL_COMMUNITY)
Admission: RE | Admit: 2016-03-09 | Discharge: 2016-03-09 | Disposition: A | Discharge status: Home / Self Care | Payer: BLUE CROSS/BLUE SHIELD | Source: Ambulatory Visit | Attending: Hematology and Oncology | Admitting: Hematology and Oncology

## 2016-03-09 DIAGNOSIS — K7689 Other specified diseases of liver: Secondary | ICD-10-CM | POA: Diagnosis not present

## 2016-03-09 DIAGNOSIS — C50212 Malignant neoplasm of upper-inner quadrant of left female breast: Secondary | ICD-10-CM

## 2016-03-09 MED ORDER — GADOBENATE DIMEGLUMINE 529 MG/ML IV SOLN
11.0000 mL | Freq: Once | INTRAVENOUS | Status: AC | PRN
  Administered 2016-03-09: 11 mL via INTRAVENOUS

## 2016-03-09 NOTE — Telephone Encounter (Signed)
I called and discussed with the patient that the liver MRI did not show any evidence of malignancy. It showed focal nodular hyperplasia which is a benign abnormality. She was extremely happy to hear this result.

## 2016-03-10 ENCOUNTER — Ambulatory Visit (HOSPITAL_BASED_OUTPATIENT_CLINIC_OR_DEPARTMENT_OTHER): Payer: BLUE CROSS/BLUE SHIELD

## 2016-03-10 ENCOUNTER — Other Ambulatory Visit: Payer: Self-pay

## 2016-03-10 ENCOUNTER — Telehealth: Payer: Self-pay | Admitting: Hematology and Oncology

## 2016-03-10 VITALS — BP 127/80 | HR 83 | Temp 98.2°F | Resp 20

## 2016-03-10 DIAGNOSIS — E538 Deficiency of other specified B group vitamins: Secondary | ICD-10-CM | POA: Diagnosis not present

## 2016-03-10 MED ORDER — CYANOCOBALAMIN 1000 MCG/ML IJ SOLN
1000.0000 ug | Freq: Once | INTRAMUSCULAR | Status: AC
  Administered 2016-03-10: 1000 ug via INTRAMUSCULAR

## 2016-03-10 MED ORDER — ANASTROZOLE 1 MG PO TABS: 1.0000 mg | ORAL_TABLET | Freq: Every day | ORAL | 3 refills | Status: DC

## 2016-03-10 NOTE — Telephone Encounter (Signed)
Received VM from pt stating she was unable to pick up new prescription for anastrozole as prescribed at last OV.  Upon chart review, prescription e-scribed to [facility] in [place].  Called pt to discuss further and pt confirms this is the pharmacy she has been trying to pick medication up from but [facility] stated they did not receive prescription.  Prescription resent to [facility] and pt to inform us if she has any additional issues obtaining this medication.  Pt verbalized understanding and is without further questions or concerns at this time.

## 2016-03-10 NOTE — Telephone Encounter (Signed)
10/04 appointment rescheduled to 10/05 per patient request. The patient called per missing her B-12 injection on 10/04.

## 2016-03-10 NOTE — Patient Instructions (Signed)

## 2016-04-06 ENCOUNTER — Ambulatory Visit (HOSPITAL_BASED_OUTPATIENT_CLINIC_OR_DEPARTMENT_OTHER): Payer: BLUE CROSS/BLUE SHIELD

## 2016-04-06 VITALS — BP 115/77 | HR 70 | Temp 98.1°F | Resp 18

## 2016-04-06 DIAGNOSIS — E538 Deficiency of other specified B group vitamins: Secondary | ICD-10-CM

## 2016-04-06 MED ORDER — CYANOCOBALAMIN 1000 MCG/ML IJ SOLN
1000.0000 ug | Freq: Once | INTRAMUSCULAR | Status: AC
Start: 2016-04-06 — End: 2016-04-06
  Administered 2016-04-06: 1000 ug via INTRAMUSCULAR

## 2016-04-12 NOTE — H&P (Signed)
Subjective:     Patient ID: Julie [name] is a 50 y.o. female.  Follow-up  10 months post bilateral latissimus flap with immediate implant reconstruction.    S/p bilateral mastectomies with immediate reconstruction with TE. Course complicated by mastectomy flap necrosis and eventual removal bilateral expanders. History Left breast multifocal invasive ductal carcinoma 0.8 cm and 0.9 cm, ER/PR + HER-2 negative, 4 SLN negative.Oncotype 9/ 7 % risk of recurrence; currently on tamoxifen X 10 years.       Objective:   Physical Exam  Cardiovascular: Normal rate, regular rhythm and normal heart sounds.   Pulmonary/Chest: Effort normal and breath sounds normal.     Back right lateral extent scar small fullness Chest soft bilateral no contracture with visible rippling bilateral superior poles, right with apparent contracture latissimus muscle and prominent over right lateral reconstruction, right pseudoptosis SN to nipple R 23 L 23 cm BW R 12 L 12 cm Nipple to IMF R 7.5 L 5 cm  Hypopigmentation bilateral nipple   Assessment:     Acquired absence bilateral breasts , history breast cancer Post explant bilateral TE S/p bilateral latissimus flaps, silicone implant placement    Plan:   Counseled her skin envelope was quite contracted at time of LD/implant placement, she declined expander staged reconstruction at that time. Now her soft tissue has stretched out and could benefit from larger, including wider base width implant to improve cleavage and upper pole fullness. Her LD muscle has contracted, doubt this can be redraped and may need to reelevate pectoralis on this side to provide additional coverage over implant, this may make rippling less visible.   Plan silicone implant exchange and lipofilling from flanks, possible infraumbilical abdomen, possible medial thighs to chest. Reviewed variable take fat graft, risk contour deformities donor site, donor site incisions pain and  compression, reviewed risk fat necrosis. Asked patient to purchase and bring compression garment to surgery that includes upper thighs.  Plan switch to Inspira Cohesive gel for more upper pole fullness and possibly less rippling. Reviewed same risks including rupture, contracture, rippling. Reviewed she has textured device- reviewed this may have some benefit on maintaining position or decreased capsular contracture. Reviewed incidence of ALCL, provided [employer] FAQs on this. We have agreed to switch to smooth device. Counseled the implant will be larger volume in order to maintain projection with wider BW. Like will need "smile" mastopexy on right given asymmetry nipple to IMF.   She also desires correction with back latissimus scar medially.  Plan OP surgery.  Mentor Siltex High Profile 275 ml implants placed bilateral.   Irene Limbo, MD [facility] Plastic & Reconstructive Surgery [phone], pin [phone]

## 2016-04-13 ENCOUNTER — Telehealth: Payer: Self-pay

## 2016-04-13 NOTE — Telephone Encounter (Signed)
Called pt back regarding symptoms of joint and bone pain that has been worsening since starting Arimidex 1 month ago. Pt states " Some mornings, I wake up and my whole body just aches, like I have the flu, and I can't seem to get out of bed." Pt states that normally, she is able to tolerate the bone/joint pain with Tamoxifen in the past, but since starting this new Arimidex medication, she states " this joint pain can be debilitating at times and I would like to know if this is coming from my new medication." Suggested for pt to take medication at night before bedtime for a few days to see if symptoms improve, but if not, she can hold on taking the medication until she sees Dr. Lindi Adie on 05/11/16. Pt verbalized understanding and will follow RN instruction. Pt noticed L neck swelling and states " It feels more like a muscular pain and sometimes I have some break out and it comes and goes.I have been having this soreness and swelling for several weeks now and even before I started arimidex and wanted to make sure that you were aware." Pt has dr. appt with Dr. Ashley Mariner next week for upcomoing breast reconstruction. Advised pt to call surgeon's office to report about neck swelling. Denies any fever, chills, or swelling down the arm. In basket placed for Dr. Ashley Mariner to advise whether or not pt needs lymhedema consult at this time. Told pt Dr. Lindi Adie out of office at this time. Pt understands plan at this time and will call for any further concerns.

## 2016-04-14 ENCOUNTER — Encounter (HOSPITAL_BASED_OUTPATIENT_CLINIC_OR_DEPARTMENT_OTHER): Payer: Self-pay | Admitting: *Deleted

## 2016-04-14 ENCOUNTER — Other Ambulatory Visit: Payer: Self-pay

## 2016-04-14 ENCOUNTER — Telehealth: Payer: Self-pay

## 2016-04-14 DIAGNOSIS — C50212 Malignant neoplasm of upper-inner quadrant of left female breast: Secondary | ICD-10-CM

## 2016-04-14 NOTE — Telephone Encounter (Signed)
Called pt and left vm regarding a referral made to lymphedema pt clinic for swelling LUE. Provided number for call back with any further questions at this time.

## 2016-04-20 ENCOUNTER — Ambulatory Visit: Payer: BLUE CROSS/BLUE SHIELD | Attending: Hematology and Oncology | Admitting: Physical Therapy

## 2016-04-20 DIAGNOSIS — R221 Localized swelling, mass and lump, neck: Secondary | ICD-10-CM | POA: Insufficient documentation

## 2016-04-20 NOTE — Therapy (Signed)
[place], [place], [address] Phone: [phone]   Fax:  [phone]  Physical Therapy Evaluation  Patient Details  Name: Julie [name] MRN: [id number] Date of Birth: 1966-04-05 Referring Provider: Dr. Lindi Adie   Encounter Date: 04/20/2016    Past Medical History:  Diagnosis Date  . Anemia    hx of low iron  . Anxiety   . Arthritis   . Breast cancer ([place]) 02/2014   ER+/PR+/Her2-  . Fibromyalgia    ?  Marland Kitchen GERD (gastroesophageal reflux disease)   . PONV (postoperative nausea and vomiting)    extremely sick  (states that she felt good after surgery 04/11/14)  . Wears contact lenses     Past Surgical History:  Procedure Laterality Date  . BREAST RECONSTRUCTION WITH PLACEMENT OF TISSUE EXPANDER AND FLEX HD (ACELLULAR HYDRATED DERMIS) Bilateral 04/11/2014   Procedure: BILATERAL BREAST RECONSTRUCTION WITH PLACEMENT OF TISSUE EXPANDER AND FLEX HD (ACELLULAR HYDRATED DERMIS);  Surgeon: Irene Limbo, MD;  Location: [place];  Service: Plastics;  Laterality: Bilateral;  . BREAST RECONSTRUCTION WITH PLACEMENT OF TISSUE EXPANDER AND FLEX HD (ACELLULAR HYDRATED DERMIS) Left 05/07/2014   Procedure: DEBRIDEMENT LEFT BREAST RECONSTRUCTION WITH PLACEMENT OF TISSUE EXPANDER AND FLEX HD (ACELLULAR HYDRATED DERMIS);  Surgeon: Irene Limbo, MD;  Location: [place];  Service: Plastics;  Laterality: Left;  . LATISSIMUS FLAP TO BREAST Bilateral 06/17/2015  . LATISSIMUS FLAP TO BREAST Bilateral 06/17/2015   Procedure: BILATERAL LATISSIMUS FLAP FOR BILATERAL BREAST RECONSTRUCTION AND PLACEMENT OF BILATERAL SILICONE IMPLANTS;  Surgeon: Irene Limbo, MD;  Location: [place];  Service: Plastics;  Laterality: Bilateral;  . REMOVAL OF BILATERAL TISSUE EXPANDERS WITH PLACEMENT OF BILATERAL BREAST IMPLANTS Right 06/29/2014   Procedure: REMOVAL OF RIGHT BREAST TISSUE EXPANDERS;  Surgeon: Irene Limbo, MD;  Location: [place];  Service:  Plastics;  Laterality: Right;  . REMOVAL OF TISSUE EXPANDER Left 06/04/2014   Procedure: [facility] OUT LEFT RECONSTRUCTIVE BREAST  EXCHANGE OF TISSUE EXPANDER;  Surgeon: Irene Limbo, MD;  Location: [place];  Service: Plastics;  Laterality: Left;  . TISSUE EXPANDER PLACEMENT Left 06/04/2014   Procedure: [facility] OUT LEFT RECONSTRUCTIVE BREAST  EXCHANGE OF TISSUE EXPANDER;  Surgeon: Irene Limbo, MD;  Location: [place];  Service: Plastics;  Laterality: Left;  . TONSILLECTOMY     99  . TOTAL MASTECTOMY Right 04/11/2014   Procedure: Right Proflactic MASTECTOMY;  Surgeon: Autumn Messing III, MD;  Location: [place];  Service: General;  Laterality: Right;  . TUBAL LIGATION     09    There were no vitals filed for this visit.       Subjective Assessment - 04/20/16 1112    Subjective Problems with swelling and pain in left side of neck, within the past month, but has had not had injury.  She has had weight gain since treatment for cancer .     Pertinent History Arthriris since her 29's, left breast cancer diagnosis sept 22 2015, bilateral mastectomy with 4 nodes removed, 8 or 9 surgeries including lat flap (n course of less than 2 years due to problems with skin healing.)  Pt feels that she is having a lot of symptoms from her medications.  Surgery planned this week to complete reconssructrion.  She had hysterectomy In May 2017 . No chemo or radiation, no previous injury to neck or shoulder     Currently in Pain? Yes   Pain Score 6   diffuse overall malaise, not  feeling good    Pain Location --  both shoulders, feet, hands. hips             [facility] PT Assessment - 04/20/16 0001      Assessment   Medical Diagnosis left breast cancer    Referring Provider Dr. Lindi Adie    Onset Date/Surgical Date 02/25/14   Hand Dominance Right     Precautions   Precautions Other (comment)     Restrictions   Weight Bearing Restrictions No     Balance Screen   Has the patient  fallen in the past 6 months No   Has the patient had a decrease in activity level because of a fear of falling?  No     Home Ecologist residence   Living Arrangements Spouse/significant other   Available Help at Discharge Available PRN/intermittently   Type of [place] to enter   Entrance Stairs-Number of Steps 2   [place] One level  does not like to go down to basement stairs      Prior Function   Level of Independence Independent with basic ADLs  can't get socks and shoes on, can't stand one leg,    Vocation Unemployed   Leisure likes to travel, with frieds, crafting, swimming, haven't been able to do regular exercise because of pain      Cognition   Overall Cognitive Status Within Functional Limits for tasks assessed     Observation/Other Assessments   Observations pt with well healed scars at back with some fullness around scar on right side.    Skin Integrity intact      Sensation   Light Touch Not tested     Coordination   Gross Motor Movements are Fluid and Coordinated No  limited by pain      Posture/Postural Control   Posture/Postural Control No significant limitations     AROM   Right Shoulder Flexion 165 Degrees  no change in pulse palpation    Right Shoulder ABduction 175 Degrees   Left Shoulder Flexion 175 Degrees   Left Shoulder ABduction 175 Degrees           LYMPHEDEMA/ONCOLOGY QUESTIONNAIRE - 04/20/16 1214      Type   Cancer Type left breast cancer      Right Upper Extremity Lymphedema   10 cm Proximal to Olecranon Process 26 cm   Olecranon Process 23.5 cm   15 cm Proximal to Ulnar Styloid Process 23.2 cm   Just Proximal to Ulnar Styloid Process 14.5 cm   Across Hand at [employer] 19.5 cm   At [place] of 2nd Digit 5.6 cm     Left Upper Extremity Lymphedema   10 cm Proximal to Olecranon Process 26 cm   Olecranon Process 23 cm   10 cm Proximal to Ulnar Styloid Process 22.8 cm    Just Proximal to Ulnar Styloid Process 14 cm   Across Hand at [employer] 19 cm   At [place] of 2nd Digit 5.5 cm   Other pt reports some swelling at left anterior neck                 [facility] Adult PT Treatment/Exercise - 04/20/16 0001      Self-Care   Self-Care Other Self-Care Comments   Other Self-Care Comments  suggested isometric exercises for abds and lower body for now with one legged stance and hip abduction  Plan - 04/20/16 1218    Clinical Impression Statement 50 yo female with generalized pain, malaise that is affecting function that has increased over the past few weeks with c/o swelling in left neck from unknown etiology.  She plans to have another reconstructive surgery on Friday of this week. She does not circumferntial differences between arms. Neck swelling is mild and no pitting is evident. Do not feel that pt has lymphedema that can be treated by our cllinic.  Recommended that she follow up with primary physician/ or MD visit to futher evaluate cause of swelling. Pt informed that she can come back to our clinic once she is cleard by Dr. Iran Planas if she wants to be seen for further exercise instruction.    Clinical Impairments Affecting Rehab Potential multiple surgeries, poor tolerance of medications    PT Frequency One time visit   PT Next Visit Plan No PT follow up at this time    Consulted and Agree with Plan of Care Patient      Patient will benefit from skilled therapeutic intervention in order to improve the following deficits and impairments:     Visit Diagnosis: Localized swelling, mass and lump, neck - Plan: PT plan of care cert/re-cert     Problem List Patient Active Problem List   Diagnosis Date Noted  . B12 deficiency 11/12/2015  . History of breast cancer 06/17/2015  . Breast cancer of upper-inner quadrant of left female breast ([place]) 02/04/2014   Donato Heinz. Owens Shark PT  [place] Levo 04/20/2016, 12:31 PM  [place], [place], [address] Phone: [phone]   Fax:  [phone]  Name: Julie [name] MRN: [id number] Date of Birth: Feb 13, 1966

## 2016-04-21 ENCOUNTER — Other Ambulatory Visit: Payer: Self-pay | Admitting: General Surgery

## 2016-04-21 ENCOUNTER — Ambulatory Visit
Admission: RE | Admit: 2016-04-21 | Discharge: 2016-04-21 | Disposition: A | Discharge status: Home / Self Care | Payer: BLUE CROSS/BLUE SHIELD | Source: Ambulatory Visit | Attending: General Surgery | Admitting: General Surgery

## 2016-04-21 DIAGNOSIS — R221 Localized swelling, mass and lump, neck: Secondary | ICD-10-CM

## 2016-04-21 MED ORDER — IOPAMIDOL (ISOVUE-370) INJECTION 76%
80.0000 mL | Freq: Once | INTRAVENOUS | Status: AC | PRN
  Administered 2016-04-21: 80 mL via INTRAVENOUS

## 2016-04-22 ENCOUNTER — Encounter (HOSPITAL_BASED_OUTPATIENT_CLINIC_OR_DEPARTMENT_OTHER)
Admission: RE | Disposition: A | Discharge status: Home / Self Care | Payer: Self-pay | Source: Ambulatory Visit | Attending: Plastic Surgery

## 2016-04-22 ENCOUNTER — Encounter (HOSPITAL_BASED_OUTPATIENT_CLINIC_OR_DEPARTMENT_OTHER): Payer: Self-pay | Admitting: Anesthesiology

## 2016-04-22 ENCOUNTER — Ambulatory Visit (HOSPITAL_BASED_OUTPATIENT_CLINIC_OR_DEPARTMENT_OTHER): Payer: BLUE CROSS/BLUE SHIELD | Admitting: Anesthesiology

## 2016-04-22 ENCOUNTER — Ambulatory Visit (HOSPITAL_BASED_OUTPATIENT_CLINIC_OR_DEPARTMENT_OTHER)
Admission: RE | Admit: 2016-04-22 | Discharge: 2016-04-23 | Disposition: A | Discharge status: Home / Self Care | Payer: BLUE CROSS/BLUE SHIELD | Source: Ambulatory Visit | Attending: Plastic Surgery | Admitting: Plastic Surgery

## 2016-04-22 DIAGNOSIS — Z9013 Acquired absence of bilateral breasts and nipples: Secondary | ICD-10-CM

## 2016-04-22 DIAGNOSIS — Z9010 Acquired absence of unspecified breast and nipple: Secondary | ICD-10-CM

## 2016-04-22 DIAGNOSIS — L905 Scar conditions and fibrosis of skin: Secondary | ICD-10-CM | POA: Insufficient documentation

## 2016-04-22 DIAGNOSIS — Z421 Encounter for breast reconstruction following mastectomy: Secondary | ICD-10-CM | POA: Diagnosis not present

## 2016-04-22 DIAGNOSIS — N6489 Other specified disorders of breast: Secondary | ICD-10-CM | POA: Insufficient documentation

## 2016-04-22 DIAGNOSIS — Z853 Personal history of malignant neoplasm of breast: Secondary | ICD-10-CM | POA: Insufficient documentation

## 2016-04-22 DIAGNOSIS — Z885 Allergy status to narcotic agent status: Secondary | ICD-10-CM | POA: Diagnosis not present

## 2016-04-22 HISTORY — PX: BREAST RECONSTRUCTION: SHX9

## 2016-04-22 HISTORY — PX: LIPOSUCTION WITH LIPOFILLING: SHX6436

## 2016-04-22 SURGERY — RECONSTRUCTION, BREAST
Anesthesia: General | Site: Chest | Laterality: Bilateral

## 2016-04-22 MED ORDER — ONDANSETRON HCL 4 MG/2ML IJ SOLN
INTRAMUSCULAR | Status: DC | PRN
  Administered 2016-04-22: 4 mg via INTRAVENOUS

## 2016-04-22 MED ORDER — ROCURONIUM BROMIDE 10 MG/ML (PF) SYRINGE
PREFILLED_SYRINGE | INTRAVENOUS | Status: AC
Start: 2016-04-22 — End: 2016-04-22
  Filled 2016-04-22: qty 10

## 2016-04-22 MED ORDER — SCOPOLAMINE 1 MG/3DAYS TD PT72
MEDICATED_PATCH | TRANSDERMAL | Status: AC
  Filled 2016-04-22: qty 1

## 2016-04-22 MED ORDER — SUFENTANIL CITRATE 50 MCG/ML IV SOLN
INTRAVENOUS | Status: AC
  Filled 2016-04-22: qty 1

## 2016-04-22 MED ORDER — SODIUM BICARBONATE 4 % IV SOLN
INTRAVENOUS | Status: DC | PRN
  Administered 2016-04-22: 200 mL via INTRAMUSCULAR

## 2016-04-22 MED ORDER — CELECOXIB 400 MG PO CAPS
400.0000 mg | ORAL_CAPSULE | ORAL | Status: AC
  Administered 2016-04-22: 400 mg via ORAL

## 2016-04-22 MED ORDER — KCL IN DEXTROSE-NACL 20-5-0.45 MEQ/L-%-% IV SOLN
INTRAVENOUS | Status: DC
  Administered 2016-04-22: 12:00:00 via INTRAVENOUS
  Filled 2016-04-22: qty 1000

## 2016-04-22 MED ORDER — SUCCINYLCHOLINE CHLORIDE 200 MG/10ML IV SOSY
PREFILLED_SYRINGE | INTRAVENOUS | Status: AC
  Filled 2016-04-22: qty 10

## 2016-04-22 MED ORDER — MIDAZOLAM HCL 2 MG/2ML IJ SOLN
INTRAMUSCULAR | Status: AC
  Filled 2016-04-22: qty 2

## 2016-04-22 MED ORDER — KETOROLAC TROMETHAMINE 30 MG/ML IJ SOLN
30.0000 mg | Freq: Three times a day (TID) | INTRAMUSCULAR | Status: DC
  Administered 2016-04-22 (×2): 30 mg via INTRAVENOUS
  Filled 2016-04-22 (×2): qty 1

## 2016-04-22 MED ORDER — CELECOXIB 200 MG PO CAPS
ORAL_CAPSULE | ORAL | Status: AC
  Filled 2016-04-22: qty 2

## 2016-04-22 MED ORDER — CHLORHEXIDINE GLUCONATE CLOTH 2 % EX PADS: 6.0000 | MEDICATED_PAD | Freq: Once | CUTANEOUS | Status: DC

## 2016-04-22 MED ORDER — ONDANSETRON 4 MG PO TBDP: 4.0000 mg | ORAL_TABLET | Freq: Four times a day (QID) | ORAL | Status: DC | PRN

## 2016-04-22 MED ORDER — FENTANYL CITRATE (PF) 100 MCG/2ML IJ SOLN
25.0000 ug | INTRAMUSCULAR | Status: DC | PRN
Start: 2016-04-22 — End: 2016-04-23
  Administered 2016-04-22: 25 ug via INTRAVENOUS
  Administered 2016-04-22: 50 ug via INTRAVENOUS
  Administered 2016-04-22: 25 ug via INTRAVENOUS

## 2016-04-22 MED ORDER — HYDROMORPHONE HCL 1 MG/ML IJ SOLN
1.0000 mg | Freq: Once | INTRAMUSCULAR | Status: AC
  Administered 2016-04-22: 0.5 mg via INTRAVENOUS

## 2016-04-22 MED ORDER — ACETAMINOPHEN 500 MG PO TABS
1000.0000 mg | ORAL_TABLET | ORAL | Status: AC
  Administered 2016-04-22: 1000 mg via ORAL

## 2016-04-22 MED ORDER — DEXAMETHASONE SODIUM PHOSPHATE 4 MG/ML IJ SOLN
INTRAMUSCULAR | Status: DC | PRN
  Administered 2016-04-22: 10 mg via INTRAVENOUS

## 2016-04-22 MED ORDER — EPHEDRINE 5 MG/ML INJ
INTRAVENOUS | Status: AC
  Filled 2016-04-22: qty 10

## 2016-04-22 MED ORDER — LIDOCAINE-EPINEPHRINE 1 %-1:100000 IJ SOLN
INTRAMUSCULAR | Status: AC
  Filled 2016-04-22: qty 2

## 2016-04-22 MED ORDER — ONDANSETRON HCL 4 MG/2ML IJ SOLN: 4.0000 mg | Freq: Once | INTRAMUSCULAR | Status: DC | PRN

## 2016-04-22 MED ORDER — PANTOPRAZOLE SODIUM 40 MG PO TBEC: 40.0000 mg | DELAYED_RELEASE_TABLET | Freq: Every day | ORAL | Status: DC

## 2016-04-22 MED ORDER — CEFAZOLIN SODIUM-DEXTROSE 2-4 GM/100ML-% IV SOLN
2.0000 g | INTRAVENOUS | Status: AC
  Administered 2016-04-22: 2 g via INTRAVENOUS

## 2016-04-22 MED ORDER — FLUOXETINE HCL 20 MG PO TABS: 20.0000 mg | ORAL_TABLET | Freq: Every day | ORAL | Status: DC

## 2016-04-22 MED ORDER — ONDANSETRON HCL 4 MG/2ML IJ SOLN
INTRAMUSCULAR | Status: AC
  Filled 2016-04-22: qty 2

## 2016-04-22 MED ORDER — DEXAMETHASONE SODIUM PHOSPHATE 10 MG/ML IJ SOLN
INTRAMUSCULAR | Status: AC
  Filled 2016-04-22: qty 1

## 2016-04-22 MED ORDER — PROPOFOL 10 MG/ML IV BOLUS
INTRAVENOUS | Status: AC
  Filled 2016-04-22: qty 20

## 2016-04-22 MED ORDER — LACTATED RINGERS IV SOLN
INTRAVENOUS | Status: DC
  Administered 2016-04-22 (×4): via INTRAVENOUS

## 2016-04-22 MED ORDER — CEFAZOLIN SODIUM-DEXTROSE 2-4 GM/100ML-% IV SOLN
INTRAVENOUS | Status: AC
  Filled 2016-04-22: qty 100

## 2016-04-22 MED ORDER — EPINEPHRINE 30 MG/30ML IJ SOLN
INTRAMUSCULAR | Status: AC
  Filled 2016-04-22: qty 1

## 2016-04-22 MED ORDER — ROCURONIUM BROMIDE 100 MG/10ML IV SOLN
INTRAVENOUS | Status: DC | PRN
  Administered 2016-04-22: 50 mg via INTRAVENOUS
  Administered 2016-04-22: 10 mg via INTRAVENOUS

## 2016-04-22 MED ORDER — CEFAZOLIN SODIUM-DEXTROSE 2-4 GM/100ML-% IV SOLN
2.0000 g | Freq: Three times a day (TID) | INTRAVENOUS | Status: AC
  Administered 2016-04-22 (×2): 2 g via INTRAVENOUS
  Filled 2016-04-22: qty 100

## 2016-04-22 MED ORDER — PHENYLEPHRINE 40 MCG/ML (10ML) SYRINGE FOR IV PUSH (FOR BLOOD PRESSURE SUPPORT)
PREFILLED_SYRINGE | INTRAVENOUS | Status: AC
  Filled 2016-04-22: qty 10

## 2016-04-22 MED ORDER — GABAPENTIN 300 MG PO CAPS
ORAL_CAPSULE | ORAL | Status: AC
  Filled 2016-04-22: qty 1

## 2016-04-22 MED ORDER — SUGAMMADEX SODIUM 200 MG/2ML IV SOLN
INTRAVENOUS | Status: DC | PRN
  Administered 2016-04-22: 125 mg via INTRAVENOUS

## 2016-04-22 MED ORDER — MIDAZOLAM HCL 2 MG/2ML IJ SOLN: 2.0000 mg | Freq: Once | INTRAMUSCULAR | Status: DC

## 2016-04-22 MED ORDER — HYDROMORPHONE HCL 1 MG/ML IJ SOLN
0.5000 mg | INTRAMUSCULAR | Status: DC | PRN
  Administered 2016-04-22: 0.5 mg via INTRAVENOUS
  Filled 2016-04-22: qty 1

## 2016-04-22 MED ORDER — FENTANYL CITRATE (PF) 100 MCG/2ML IJ SOLN
INTRAMUSCULAR | Status: AC
  Filled 2016-04-22: qty 2

## 2016-04-22 MED ORDER — EPHEDRINE SULFATE 50 MG/ML IJ SOLN
INTRAMUSCULAR | Status: DC | PRN
  Administered 2016-04-22: 10 mg via INTRAVENOUS

## 2016-04-22 MED ORDER — LIDOCAINE HCL (PF) 1 % IJ SOLN
INTRAMUSCULAR | Status: AC
  Filled 2016-04-22: qty 120

## 2016-04-22 MED ORDER — MORPHINE SULFATE (PF) 4 MG/ML IV SOLN
INTRAVENOUS | Status: AC
  Filled 2016-04-22: qty 1

## 2016-04-22 MED ORDER — SCOPOLAMINE 1 MG/3DAYS TD PT72
1.0000 | MEDICATED_PATCH | Freq: Once | TRANSDERMAL | Status: AC | PRN
  Administered 2016-04-22: 1 via TRANSDERMAL

## 2016-04-22 MED ORDER — SODIUM BICARBONATE 4 % IV SOLN
INTRAVENOUS | Status: AC
  Filled 2016-04-22: qty 20

## 2016-04-22 MED ORDER — OXYCODONE HCL 5 MG PO TABS: 5.0000 mg | ORAL_TABLET | ORAL | 0 refills | Status: DC | PRN

## 2016-04-22 MED ORDER — HYDROMORPHONE HCL 1 MG/ML IJ SOLN
INTRAMUSCULAR | Status: AC
  Filled 2016-04-22: qty 1

## 2016-04-22 MED ORDER — FENTANYL CITRATE (PF) 100 MCG/2ML IJ SOLN: 50.0000 ug | INTRAMUSCULAR | Status: DC | PRN

## 2016-04-22 MED ORDER — ATROPINE SULFATE 0.4 MG/ML IV SOSY
PREFILLED_SYRINGE | INTRAVENOUS | Status: AC
  Filled 2016-04-22: qty 2.5

## 2016-04-22 MED ORDER — SUFENTANIL CITRATE 50 MCG/ML IV SOLN
INTRAVENOUS | Status: DC | PRN
  Administered 2016-04-22 (×3): 5 ug via INTRAVENOUS
  Administered 2016-04-22 (×2): 10 ug via INTRAVENOUS
  Administered 2016-04-22: 5 ug via INTRAVENOUS

## 2016-04-22 MED ORDER — SULFAMETHOXAZOLE-TRIMETHOPRIM 800-160 MG PO TABS: 1.0000 | ORAL_TABLET | Freq: Two times a day (BID) | ORAL | 0 refills | Status: DC

## 2016-04-22 MED ORDER — ONDANSETRON HCL 4 MG/2ML IJ SOLN: 4.0000 mg | Freq: Four times a day (QID) | INTRAMUSCULAR | Status: DC | PRN

## 2016-04-22 MED ORDER — MORPHINE SULFATE 10 MG/ML IJ SOLN
INTRAMUSCULAR | Status: DC | PRN
  Administered 2016-04-22 (×2): 2 mg via INTRAVENOUS

## 2016-04-22 MED ORDER — MIDAZOLAM HCL 2 MG/2ML IJ SOLN
1.0000 mg | INTRAMUSCULAR | Status: DC | PRN
  Administered 2016-04-22: 2 mg via INTRAVENOUS
  Administered 2016-04-22: 1 mg via INTRAVENOUS

## 2016-04-22 MED ORDER — ACETAMINOPHEN 500 MG PO TABS
ORAL_TABLET | ORAL | Status: AC
  Filled 2016-04-22: qty 2

## 2016-04-22 MED ORDER — SODIUM CHLORIDE 0.9 % IV SOLN
INTRAVENOUS | Status: DC | PRN
  Administered 2016-04-22: 1000 mL

## 2016-04-22 MED ORDER — GABAPENTIN 300 MG PO CAPS
300.0000 mg | ORAL_CAPSULE | ORAL | Status: AC
  Administered 2016-04-22: 300 mg via ORAL

## 2016-04-22 MED ORDER — LIDOCAINE 2% (20 MG/ML) 5 ML SYRINGE
INTRAMUSCULAR | Status: AC
  Filled 2016-04-22: qty 5

## 2016-04-22 MED ORDER — OXYCODONE HCL 5 MG PO TABS
5.0000 mg | ORAL_TABLET | ORAL | Status: DC | PRN
  Administered 2016-04-22 (×2): 10 mg via ORAL
  Administered 2016-04-22: 5 mg via ORAL
  Administered 2016-04-23: 10 mg via ORAL
  Filled 2016-04-22 (×3): qty 2
  Filled 2016-04-22: qty 1
  Filled 2016-04-22: qty 2

## 2016-04-22 MED ORDER — DOCUSATE SODIUM 100 MG PO CAPS
100.0000 mg | ORAL_CAPSULE | Freq: Two times a day (BID) | ORAL | Status: DC | PRN
  Administered 2016-04-22: 100 mg via ORAL
  Filled 2016-04-22: qty 1

## 2016-04-22 MED ORDER — PROPOFOL 10 MG/ML IV BOLUS
INTRAVENOUS | Status: DC | PRN
  Administered 2016-04-22: 150 mg via INTRAVENOUS

## 2016-04-22 SURGICAL SUPPLY — 89 items
BAG DECANTER FOR FLEXI CONT (MISCELLANEOUS) ×3 IMPLANT
BANDAGE ACE 6X5 VEL STRL LF (GAUZE/BANDAGES/DRESSINGS) IMPLANT
BINDER ABDOMINAL 10 UNV 27-48 (MISCELLANEOUS) IMPLANT
BINDER ABDOMINAL 12 SM 30-45 (SOFTGOODS) IMPLANT
BINDER BREAST LRG (GAUZE/BANDAGES/DRESSINGS) IMPLANT
BINDER BREAST MEDIUM (GAUZE/BANDAGES/DRESSINGS) IMPLANT
BINDER BREAST XLRG (GAUZE/BANDAGES/DRESSINGS) IMPLANT
BINDER BREAST XXLRG (GAUZE/BANDAGES/DRESSINGS) IMPLANT
BLADE SURG 10 STRL SS (BLADE) ×10 IMPLANT
BLADE SURG 11 STRL SS (BLADE) ×3 IMPLANT
BLADE SURG 15 STRL LF DISP TIS (BLADE) IMPLANT
BLADE SURG 15 STRL SS (BLADE)
BNDG GAUZE ELAST 4 BULKY (GAUZE/BANDAGES/DRESSINGS) ×6 IMPLANT
CANISTER LIPO FAT HARVEST (MISCELLANEOUS) ×3 IMPLANT
CANISTER SUCT 1200ML W/VALVE (MISCELLANEOUS) ×3 IMPLANT
CHLORAPREP W/TINT 26ML (MISCELLANEOUS) ×3 IMPLANT
COMPRESSION GARMENT LG MOREWEL (MISCELLANEOUS) IMPLANT
COMPRESSION GARMENT MD MOREWEL (MISCELLANEOUS) IMPLANT
COMPRESSION GARMENT XL MOREWEL (MISCELLANEOUS) IMPLANT
COVER MAYO STAND STRL (DRAPES) ×4 IMPLANT
DECANTER SPIKE VIAL GLASS SM (MISCELLANEOUS) IMPLANT
DRAIN CHANNEL 15F RND FF W/TCR (WOUND CARE) ×3 IMPLANT
DRAPE IMP U-DRAPE 54X76 (DRAPES) IMPLANT
DRSG PAD ABDOMINAL 8X10 ST (GAUZE/BANDAGES/DRESSINGS) ×8 IMPLANT
ELECT BLADE 4.0 EZ CLEAN MEGAD (MISCELLANEOUS) ×3
ELECT COATED BLADE 2.86 ST (ELECTRODE) ×3 IMPLANT
ELECT REM PT RETURN 9FT ADLT (ELECTROSURGICAL) ×3
ELECTRODE BLDE 4.0 EZ CLN MEGD (MISCELLANEOUS) ×2 IMPLANT
ELECTRODE REM PT RTRN 9FT ADLT (ELECTROSURGICAL) ×2 IMPLANT
EVACUATOR SILICONE 100CC (DRAIN) ×3 IMPLANT
FILTER LIPOSUCTION (MISCELLANEOUS) ×3 IMPLANT
GLOVE BIO SURGEON STRL SZ 6 (GLOVE) ×6 IMPLANT
GLOVE BIOGEL PI IND STRL 7.0 (GLOVE) IMPLANT
GLOVE BIOGEL PI IND STRL 8 (GLOVE) IMPLANT
GLOVE BIOGEL PI INDICATOR 7.0 (GLOVE) ×2
GLOVE BIOGEL PI INDICATOR 8 (GLOVE) ×1
GLOVE ECLIPSE 6.5 STRL STRAW (GLOVE) ×2 IMPLANT
GLOVE SURG SS PI 7.5 STRL IVOR (GLOVE) ×1 IMPLANT
GOWN STRL REUS W/ TWL LRG LVL3 (GOWN DISPOSABLE) ×4 IMPLANT
GOWN STRL REUS W/TWL LRG LVL3 (GOWN DISPOSABLE) ×9
IMPL BREAST GEL 5XFULL RND 385 (Breast) IMPLANT
IMPL BRST GEL 5XFULL RND 385CC (Breast) ×4 IMPLANT
IMPLANT BREAST GEL 385CC (Breast) ×6 IMPLANT
IV NS 500ML (IV SOLUTION) ×3
IV NS 500ML BAXH (IV SOLUTION) ×2 IMPLANT
KIT FILL SYSTEM UNIVERSAL (SET/KITS/TRAYS/PACK) IMPLANT
LINER CANISTER 1000CC FLEX (MISCELLANEOUS) ×3 IMPLANT
LIQUID BAND (GAUZE/BANDAGES/DRESSINGS) ×4 IMPLANT
NDL HYPO 25X1 1.5 SAFETY (NEEDLE) IMPLANT
NDL SAFETY ECLIPSE 18X1.5 (NEEDLE) ×2 IMPLANT
NEEDLE HYPO 18GX1.5 SHARP (NEEDLE) ×3
NEEDLE HYPO 25X1 1.5 SAFETY (NEEDLE) IMPLANT
NS IRRIG 1000ML POUR BTL (IV SOLUTION) ×3 IMPLANT
PACK BASIN DAY SURGERY FS (CUSTOM PROCEDURE TRAY) ×3 IMPLANT
PACK UNIVERSAL I (CUSTOM PROCEDURE TRAY) ×3 IMPLANT
PAD ALCOHOL SWAB (MISCELLANEOUS) ×4 IMPLANT
PENCIL BUTTON HOLSTER BLD 10FT (ELECTRODE) ×3 IMPLANT
PIN SAFETY STERILE (MISCELLANEOUS) ×3 IMPLANT
SHEET MEDIUM DRAPE 40X70 STRL (DRAPES) ×5 IMPLANT
SIZER BREAST REUSE 365CC (SIZER) ×3
SIZER BREAST REUSE 385CC (SIZER) ×3
SIZER BRST REUSE 365CC (SIZER) IMPLANT
SIZER BRST REUSE 385CC (SIZER) IMPLANT
SLEEVE SCD COMPRESS KNEE MED (MISCELLANEOUS) ×3 IMPLANT
SPONGE LAP 18X18 X RAY DECT (DISPOSABLE) ×8 IMPLANT
STAPLER VISISTAT 35W (STAPLE) ×3 IMPLANT
STRIP CLOSURE SKIN 1/2X4 (GAUZE/BANDAGES/DRESSINGS) ×4 IMPLANT
SUT ETHILON 2 0 FS 18 (SUTURE) ×3 IMPLANT
SUT MNCRL AB 4-0 PS2 18 (SUTURE) ×4 IMPLANT
SUT PDS 3-0 CT2 (SUTURE)
SUT PDS AB 2-0 CT2 27 (SUTURE) ×2 IMPLANT
SUT PDS II 3-0 CT2 27 ABS (SUTURE) IMPLANT
SUT PROLENE 2 0 CT2 30 (SUTURE) IMPLANT
SUT VIC AB 3-0 PS1 18 (SUTURE)
SUT VIC AB 3-0 PS1 18XBRD (SUTURE) IMPLANT
SUT VIC AB 3-0 SH 27 (SUTURE) ×6
SUT VIC AB 3-0 SH 27X BRD (SUTURE) IMPLANT
SUT VICRYL 4-0 PS2 18IN ABS (SUTURE) ×5 IMPLANT
SYR 10ML LL (SYRINGE) ×9 IMPLANT
SYR 50ML LL SCALE MARK (SYRINGE) ×13 IMPLANT
SYR BULB IRRIGATION 50ML (SYRINGE) ×4 IMPLANT
SYR CONTROL 10ML LL (SYRINGE) IMPLANT
SYR TB 1ML LL NO SAFETY (SYRINGE) ×2 IMPLANT
TOWEL OR 17X24 6PK STRL BLUE (TOWEL DISPOSABLE) ×6 IMPLANT
TUBE CONNECTING 20X1/4 (TUBING) ×4 IMPLANT
TUBING INFILTRATION IT-10001 (TUBING) ×3 IMPLANT
TUBING SET GRADUATE ASPIR 12FT (MISCELLANEOUS) ×3 IMPLANT
UNDERPAD 30X30 (UNDERPADS AND DIAPERS) ×6 IMPLANT
YANKAUER SUCT BULB TIP NO VENT (SUCTIONS) ×3 IMPLANT

## 2016-04-22 NOTE — Progress Notes (Signed)
  Patient with pain abdomen and firmness post liposuction.  On exam, firm over supra and infraumbilical abdomen, especially on right with TTP. No drainage from liposuction port sites. Flanks soft.  Counseled likely some hematoma but if not growing, vitals stable no intervention exception continued compression. Will take several weeks to resolve/soften, can start massage when less tender. Will feel lumpy during this time, can expect bruising to travel to mons by gravity.  Irene Limbo, MD [facility] Plastic & Reconstructive Surgery [phone], pin [phone]

## 2016-04-22 NOTE — Anesthesia Procedure Notes (Signed)
Procedure Name: Intubation Date/Time: 04/22/2016 7:38 AM Performed by: Melynda Ripple D Pre-anesthesia Checklist: Patient identified, Emergency Drugs available, Suction available and Patient being monitored Patient Re-evaluated:Patient Re-evaluated prior to inductionOxygen Delivery Method: Circle system utilized Preoxygenation: Pre-oxygenation with 100% oxygen Intubation Type: IV induction Ventilation: Mask ventilation without difficulty Laryngoscope Size: Mac and 3 Grade View: Grade I Tube type: Oral Number of attempts: 1 Airway Equipment and Method: Stylet and Oral airway Placement Confirmation: ETT inserted through vocal cords under direct vision,  positive ETCO2 and breath sounds checked- equal and bilateral Secured at: 22 cm Tube secured with: Tape Dental Injury: Teeth and Oropharynx as per pre-operative assessment

## 2016-04-22 NOTE — Interval H&P Note (Signed)
History and Physical Interval Note:  04/22/2016 6:56 AM  Julie [name]  has presented today for surgery, with the diagnosis of HISTORY OF BREAST CANCER,ACQUIRED ABCESS BREAST  The various methods of treatment have been discussed with the patient and family. After consideration of risks, benefits and other options for treatment, the patient has consented to  Procedure(s): REVISION BILATERAL  BREAST RECONSTRUCTION WITH REMOVAL AND PLACEMENT OF  SILICONE IMPLANTS (Bilateral) LIPO FILLING FROM ABDOMEN TO BILATERAL CHEST AND SCAR REVISION (Bilateral) as a surgical intervention .  The patient's history has been reviewed, patient examined, no change in status, stable for surgery.  I have reviewed the patient's chart and labs.  Questions were answered to the patient's satisfaction.     Shaquel Josephson

## 2016-04-22 NOTE — Anesthesia Preprocedure Evaluation (Addendum)
Anesthesia Evaluation  Patient identified by MRN, date of birth, ID band Patient awake    Reviewed: Allergy & Precautions, NPO status , Patient's Chart, lab work & pertinent test results  Airway Mallampati: II  TM Distance: >3 FB Neck ROM: Full    Dental  (+) Teeth Intact, Dental Advisory Given   Pulmonary    breath sounds clear to auscultation       Cardiovascular  Rhythm:Regular Rate:Normal     Neuro/Psych    GI/Hepatic   Endo/Other    Renal/GU      Musculoskeletal   Abdominal   Peds  Hematology   Anesthesia Other Findings   Reproductive/Obstetrics                             Anesthesia Physical Anesthesia Plan  ASA: III  Anesthesia Plan: General   Post-op Pain Management:    Induction: Intravenous  Airway Management Planned: Oral ETT  Additional Equipment:   Intra-op Plan:   Post-operative Plan: Extubation in OR  Informed Consent: I have reviewed the patients History and Physical, chart, labs and discussed the procedure including the risks, benefits and alternatives for the proposed anesthesia with the patient or authorized representative who has indicated his/her understanding and acceptance.   Dental advisory given  Plan Discussed with: CRNA and Anesthesiologist  Anesthesia Plan Comments:        Anesthesia Quick Evaluation  

## 2016-04-22 NOTE — Op Note (Signed)
Operative Note   DATE OF OPERATION: 11.17.17  LOCATION: [facility]-observation  SURGICAL DIVISION: Plastic Surgery  PREOPERATIVE DIAGNOSES:  1. History breast cancer 2. Acquired absence breasts  POSTOPERATIVE DIAGNOSES:  same  PROCEDURE:  1. Revision bilateral breast reconstruction including silicone implant exchange, capsulorraphy 2. Lipofilling from abdomen to bilateral chest. 3. Complex repair right back 2 cm  SURGEON: Irene Limbo MD MBA  ASSISTANT: none  ANESTHESIA:  General.   EBL: 40 ml  COMPLICATIONS: None immediate.   INDICATIONS FOR PROCEDURE:  The patient, Julie [name], is a 50 y.o. female born on 05/30/1966, is here for revision bilateral breast reconstruction. She underwent bilateral nipple sparing mastectomies complicated by bilateral mastectomy flap necrosis requiring eventual removal expanders. She is 10 months post bilateral latissimus flaps and direct to implant reconstruction.   FINDINGS: Left thoracodorsal nerve identified and divided. Removed intact Mentor Siltex 275 ml implants and Natrelle Inspira Smooth Round Cohesive Full Profile 385 ml implants placed bilateral, REF [id number] RIGHT SN [id number] LEFT [id number].  DESCRIPTION OF PROCEDURE:  The patient's operative site was marked with the patient in the preoperative area to mark sternal notch, chest midline, anterior axillary lines, and areas of depression marked for lipofilling. Area for fat harvest from supra and infra umbilical abdomen and bilateral flanks marked. The patient was taken to the operating room. SCDs were placed and IV antibiotics were given. A time out was performed and all information was confirmed to be correct. The patient's chest and abdomen were then prepped and draped in a sterile fashion.   Incision made in right chest inframammary fold mastectomy scar. Incision carried through to latissimus muscle which was divided in direction muscle fibers and intact implant removed. Lateral  capsulorraphy completed with 2-0 PDS in figure of eight fashion at desired anterior axillary line to chest wall. Inframammary fold set with interrupted 2-0 PDS sutures. Sizer was placed, and I then directed attention to left breast. Incision made in prior scar and implant removed. Capsulotomies performed inferiorly to lower inframammary fold to level symmetric with right. Inframmamary fold stabilized with interrupted 2-0 PDS suture. Capsulotomies performed in radial scoring fashion over lower pole. Lateral capsulorraphy completed with 2-0 PDS in figure of eight fashion at desired anterior axillary line from mastectomy flap capsule to chest wall. Sizer placed and patient brought to upright sitting position. Natrelle Inspira Full Projection Smooth Round 385 ml implants selected.    Stab incision made over bilateral lateral abdomen and tumescent fluid infiltrated over supraumbilical abdomen and bilateral flanks, total 250 ml tumescent infiltrated. Power assisted liposuction performed to endpoint symmetric contour and soft tissue thickness, total lipoaspirate 200 ml. The fat was then washed and prepared by gravity for infiltration. Harvested fat was then infiltrated in subcutaneous and intramuscular planes over superior medial poles.   At this time bilateral cavities irrigated with solution containing Ancef, gentamicin, an bacitracin. Pocket inspected for hemostasis and then irrigated with Betadine. Implant placed in each cavity and orientation implant confirmed. Closure was completed with running 3-0 vicryl for approximation of capsule and latissimus muscle. 4-0 vicryl was placed in dermis and running 4-0 monocryl was used to close skin. The abdominal incisions were approximated with 4-0 monocryl. Dry dressing and abdominal compression garment and breast binder applied.   Right back scar dog ear excised sharply and layered closure completed with 4-0 vicryl in dermis and 4-0 monocryl skin closure. Steri strips  applied to all incisions.  The patient was allowed to wake from anesthesia, extubated and taken to  the recovery room in satisfactory condition.   SPECIMENS: right mastectomy flap  DRAINS: none  Irene Limbo, MD [facility] Plastic & Reconstructive Surgery [phone], pin [phone]

## 2016-04-22 NOTE — Anesthesia Postprocedure Evaluation (Signed)
Anesthesia Post Note  Patient: Julie [name]  Procedure(s) Performed: Procedure(s) (LRB): REVISION BILATERAL  BREAST RECONSTRUCTION WITH REMOVAL AND PLACEMENT OF  SILICONE IMPLANTS (Bilateral) LIPO FILLING FROM ABDOMEN TO BILATERAL CHEST AND SCAR REVISION (Bilateral)  Patient location during evaluation: PACU Anesthesia Type: General Level of consciousness: awake, awake and alert, oriented and sedated Pain management: pain level controlled Vital Signs Assessment: post-procedure vital signs reviewed and stable Respiratory status: spontaneous breathing, nonlabored ventilation, respiratory function stable and patient connected to nasal cannula oxygen Cardiovascular status: blood pressure returned to baseline Postop Assessment: no headache Anesthetic complications: no    Last Vitals:  Vitals:   04/22/16 1200 04/22/16 1230  BP:  118/78  Pulse: 91 88  Resp: 12 16  Temp:  36.4 C    Last Pain:  Vitals:   04/22/16 1230  TempSrc:   PainSc: 8                  Emry Tobin COKER

## 2016-04-22 NOTE — Transfer of Care (Signed)
Immediate Anesthesia Transfer of Care Note  Patient: Julie [name]  Procedure(s) Performed: Procedure(s): REVISION BILATERAL  BREAST RECONSTRUCTION WITH REMOVAL AND PLACEMENT OF  SILICONE IMPLANTS (Bilateral) LIPO FILLING FROM ABDOMEN TO BILATERAL CHEST AND SCAR REVISION (Bilateral)  Patient Location: PACU  Anesthesia Type:General  Level of Consciousness: awake, alert  and oriented  Airway & Oxygen Therapy: Patient Spontanous Breathing and Patient connected to face mask oxygen  Post-op Assessment: Report given to RN and Post -op Vital signs reviewed and stable  Post vital signs: Reviewed and stable  Last Vitals:  Vitals:   04/22/16 0706 04/22/16 1030  BP: 118/74   Pulse: 68   Resp: 20   Temp: 36.8 C 36.6 C    Last Pain:  Vitals:   04/22/16 0706  TempSrc: Oral         Complications: No apparent anesthesia complications

## 2016-04-23 DIAGNOSIS — Z421 Encounter for breast reconstruction following mastectomy: Secondary | ICD-10-CM | POA: Diagnosis not present

## 2016-04-25 ENCOUNTER — Encounter (HOSPITAL_BASED_OUTPATIENT_CLINIC_OR_DEPARTMENT_OTHER): Payer: Self-pay | Admitting: Plastic Surgery

## 2016-05-04 ENCOUNTER — Ambulatory Visit: Payer: BLUE CROSS/BLUE SHIELD

## 2016-05-07 ENCOUNTER — Other Ambulatory Visit: Payer: Self-pay | Admitting: Hematology and Oncology

## 2016-05-07 DIAGNOSIS — C50212 Malignant neoplasm of upper-inner quadrant of left female breast: Secondary | ICD-10-CM

## 2016-05-10 ENCOUNTER — Other Ambulatory Visit: Payer: Self-pay

## 2016-05-10 NOTE — Assessment & Plan Note (Signed)
Left breast multifocal invasive ductal carcinoma 0.8 cm and 0.9 cm, ER/PR positive HER-2 negative (ratio is 0.97 and 1.23) Ki-67 ranged 14-16% status post bilateral mastectomies 4 SLN negative.Oncotype DX recurrence score 9, 7 percent risk of recurrence; currently on tamoxifen X10 years Started 05/26/2015 Due to infections, the breast expanders had to be removed.  Patient had undergone breast reconstruction with latissimus dorsi flap in January 2017. Patient underwent hysterectomy with bilateral salpingo-oophorectomy on 10/17/2015 (discontinued tamoxifen due to uterine thickening)  Breast cancer surveillance: 1. Chest and axillary exams every 6 months: Breast exam 02/29/2016 is without any concerns 2. No role of imaging because she had bilateral mastectomies  B-12 deficiency: currentlyon B-12 injections. plan to continue with B 12 injections since they have improved her energy levels.   02/25/2016 CT Abd/Pelvis: No evidence of metastatic disease. With a 3 cm focus right liver suggestive of benign finding liver MRI recommended. 6 cm solitary right lower lobe lung nodule favor benign recommend CT follow-up in 6 months, bone scan negative. I reviewed the scans in detail with her. Based on the radiology recommendation, we are obtaining an MRI of the liver and will plan on obtaining a CT chest in 6 months. liver MRI 03/09/16: Liver shows focal nodular hyperplasia  Return to clinic in 6 months

## 2016-05-11 ENCOUNTER — Ambulatory Visit: Payer: BLUE CROSS/BLUE SHIELD

## 2016-05-11 ENCOUNTER — Ambulatory Visit (HOSPITAL_BASED_OUTPATIENT_CLINIC_OR_DEPARTMENT_OTHER): Payer: BLUE CROSS/BLUE SHIELD | Admitting: Hematology and Oncology

## 2016-05-11 ENCOUNTER — Encounter: Payer: Self-pay | Admitting: Hematology and Oncology

## 2016-05-11 VITALS — BP 112/68 | HR 91 | Temp 98.2°F | Resp 18 | Wt 133.2 lb

## 2016-05-11 DIAGNOSIS — R911 Solitary pulmonary nodule: Secondary | ICD-10-CM

## 2016-05-11 DIAGNOSIS — Z171 Estrogen receptor negative status [ER-]: Principal | ICD-10-CM

## 2016-05-11 DIAGNOSIS — C50212 Malignant neoplasm of upper-inner quadrant of left female breast: Secondary | ICD-10-CM | POA: Diagnosis not present

## 2016-05-11 DIAGNOSIS — E538 Deficiency of other specified B group vitamins: Secondary | ICD-10-CM

## 2016-05-11 MED ORDER — CYANOCOBALAMIN 1000 MCG/ML IJ SOLN
1000.0000 ug | Freq: Once | INTRAMUSCULAR | Status: AC
  Administered 2016-05-11: 1000 ug via INTRAMUSCULAR

## 2016-05-11 MED ORDER — TAMOXIFEN CITRATE 20 MG PO TABS: 20.0000 mg | ORAL_TABLET | Freq: Every day | ORAL | 3 refills | Status: DC

## 2016-05-11 NOTE — Patient Instructions (Signed)
Cyanocobalamin, Vitamin B12 injection What is this medicine? CYANOCOBALAMIN (sye an oh koe BAL a min) is a man made form of vitamin B12. Vitamin B12 is used in the growth of healthy blood cells, nerve cells, and proteins in the body. It also helps with the metabolism of fats and carbohydrates. This medicine is used to treat people who can not absorb vitamin B12. This medicine may be used for other purposes; ask your health care provider or pharmacist if you have questions. COMMON BRAND NAME(S): B-12 Compliance Kit, B-12 Injection Kit, Cyomin, LA-12, Nutri-Twelve, Physicians EZ Use B-12, Primabalt What should I tell my health care provider before I take this medicine? They need to know if you have any of these conditions: -kidney disease -Leber's disease -megaloblastic anemia -an unusual or allergic reaction to cyanocobalamin, cobalt, other medicines, foods, dyes, or preservatives -pregnant or trying to get pregnant -breast-feeding How should I use this medicine? This medicine is injected into a muscle or deeply under the skin. It is usually given by a health care professional in a clinic or doctor's office. However, your doctor may teach you how to inject yourself. Follow all instructions. Talk to your pediatrician regarding the use of this medicine in children. Special care may be needed. Overdosage: If you think you have taken too much of this medicine contact a poison control center or emergency room at once. NOTE: This medicine is only for you. Do not share this medicine with others. What if I miss a dose? If you are given your dose at a clinic or doctor's office, call to reschedule your appointment. If you give your own injections and you miss a dose, take it as soon as you can. If it is almost time for your next dose, take only that dose. Do not take double or extra doses. What may interact with this medicine? -colchicine -heavy alcohol intake This list may not describe all possible  interactions. Give your health care provider a list of all the medicines, herbs, non-prescription drugs, or dietary supplements you use. Also tell them if you smoke, drink alcohol, or use illegal drugs. Some items may interact with your medicine. What should I watch for while using this medicine? Visit your doctor or health care professional regularly. You may need blood work done while you are taking this medicine. You may need to follow a special diet. Talk to your doctor. Limit your alcohol intake and avoid smoking to get the best benefit. What side effects may I notice from receiving this medicine? Side effects that you should report to your doctor or health care professional as soon as possible: -allergic reactions like skin rash, itching or hives, swelling of the face, lips, or tongue -blue tint to skin -chest tightness, pain -difficulty breathing, wheezing -dizziness -red, swollen painful area on the leg Side effects that usually do not require medical attention (report to your doctor or health care professional if they continue or are bothersome): -diarrhea -headache This list may not describe all possible side effects. Call your doctor for medical advice about side effects. You may report side effects to FDA at 1-800-FDA-1088. Where should I keep my medicine? Keep out of the reach of children. Store at room temperature between 15 and 30 degrees C (59 and 85 degrees F). Protect from light. Throw away any unused medicine after the expiration date. NOTE: This sheet is a summary. It may not cover all possible information. If you have questions about this medicine, talk to your doctor, pharmacist, or   health care provider.  2017 Elsevier/Gold Standard (2007-09-03 22:10:20)  

## 2016-05-11 NOTE — Progress Notes (Signed)
Patient Care Team: Maisie Fus, MD as PCP - General (Obstetrics and Gynecology)  DIAGNOSIS:  Encounter Diagnoses  Name Primary?  . Malignant neoplasm of upper-inner quadrant of left breast in female, estrogen receptor negative ([place]) Yes  . B12 deficiency     SUMMARY OF ONCOLOGIC HISTORY:   Breast cancer of upper-inner quadrant of left female breast ([place])   02/25/2014 Initial Biopsy    Left breast mass 11:00: Invasive ductal carcinoma grade 1, ER 100%, PR 99%, Ki-67 14%, HER-2 negative ratio 1.5      03/05/2014 Breast MRI    Left breast 11:00, 10 x 7 x 7 mm, in addition 7 x 5 x 6 mm masslike area, negative lymph nodes      04/11/2014 Surgery    Bilateral mastectomies: Left breast: Multifocal IDC 0.8 cm and 0.9 cm with DCIS 4 SLN negative, bilateral nipple biopsy is negative: Right breast mastectomy no malignancy 2 lymph nodes negative: ER/PR positive HER-2 negative Ki-67 14-16%       Procedure    Genetic testing did not reveal any mutations      05/27/2014 -  Anti-estrogen oral therapy    Tamoxifen 20 mg daily switched to anastrozole because patient had uterine bleeding; but was not tolerated, switched back to tamoxifen 05/11/2016      06/17/2015 Surgery    Breast reconstruction with latissimus dorsi muscle flap and an implant      10/20/2015 Surgery    Hysterectomy with bilateral salpingo-oophorectomy      02/25/2016 Imaging    CT Abd/Pelvis: No evidence of metastatic disease. With a 3 cm focus right liver suggestive of benign finding liver MRI recommended. 6 cm solitary right lower lobe lung nodule favor benign recommend CT follow-up in 6 months, bone scan negative       CHIEF COMPLIANT: Follow-up on tamoxifen  INTERVAL HISTORY: Julie [name] is a 50 year old with above-mentioned history of left breast cancer treated with bilateral mastectomies and is currently on adjuvant tamoxifen therapy. She had bilateral breast reconstruction. She also hysterectomy with  bilateral salpingo-oophorectomy. She was switched to anastrozole because she was having uterine problems. But on anastrozole she had profound myalgias and arthralgias and couldn't tolerate it. This was discontinued.  She had a CT of abdomen pelvis from September 2017 which showed a small focus in the liver. There also was a small focus of right lung nodule. It was felt to be benign. Liver MRI revealed that the lesions in the liver was benign.  She was diagnosed with B 12 deficiency and is currently receiving B-12 injections. This is helping her fatigue. She also had a CT of the neck because of a swollen neck and it was felt to be muscle inflammation.  REVIEW OF SYSTEMS:   Constitutional: Denies fevers, chills or abnormal weight loss Eyes: Denies blurriness of vision Ears, nose, mouth, throat, and face: Denies mucositis or sore throat Respiratory: Denies cough, dyspnea or wheezes Cardiovascular: Denies palpitation, chest discomfort Gastrointestinal:  Denies nausea, heartburn or change in bowel habits Skin: Denies abnormal skin rashes Lymphatics: Denies new lymphadenopathy or easy bruising Neurological:Denies numbness, tingling or new weaknesses Behavioral/Psych: Mood is stable, no new changes  Extremities: No lower extremity edema Breast:  denies any pain or lumps or nodules in either breasts All other systems were reviewed with the patient and are negative.  I have reviewed the past medical history, past surgical history, social history and family history with the patient and they are unchanged from previous note.  ALLERGIES:  is allergic to codeine; hydrocodone; and other.  MEDICATIONS:  Current Outpatient Prescriptions  Medication Sig Dispense Refill  . cetirizine-pseudoephedrine (ZYRTEC-D) 5-120 MG tablet Take 1 tablet by mouth daily as needed for allergies.    Marland Kitchen FLUoxetine (PROZAC) 20 MG tablet Take 1 tablet (20 mg total) by mouth daily. 30 tablet 0  . loratadine (CLARITIN) 10 MG  tablet Take 10 mg by mouth daily as needed for allergies.    Marland Kitchen omeprazole (PRILOSEC) 20 MG capsule Take 20 mg by mouth as needed.   3  . oxyCODONE (ROXICODONE) 5 MG immediate release tablet Take 1-2 tablets (5-10 mg total) by mouth every 4 (four) hours as needed for severe pain. 40 tablet 0  . sulfamethoxazole-trimethoprim (BACTRIM DS,SEPTRA DS) 800-160 MG tablet Take 1 tablet by mouth 2 (two) times daily. 12 tablet 0  . tamoxifen (NOLVADEX) 20 MG tablet Take 1 tablet (20 mg total) by mouth daily. 90 tablet 3   No current facility-administered medications for this visit.     PHYSICAL EXAMINATION: ECOG PERFORMANCE STATUS: 0 - Asymptomatic  Vitals:   05/11/16 1132  BP: 112/68  Pulse: 91  Resp: 18  Temp: 98.2 F (36.8 C)   Filed Weights   05/11/16 1132  Weight: 133 lb 3.2 oz (60.4 kg)    GENERAL:alert, no distress and comfortable SKIN: skin color, texture, turgor are normal, no rashes or significant lesions EYES: normal, Conjunctiva are pink and non-injected, sclera clear OROPHARYNX:no exudate, no erythema and lips, buccal mucosa, and tongue normal  NECK: supple, thyroid normal size, non-tender, without nodularity LYMPH:  no palpable lymphadenopathy in the cervical, axillary or inguinal LUNGS: clear to auscultation and percussion with normal breathing effort HEART: regular rate & rhythm and no murmurs and no lower extremity edema ABDOMEN:abdomen soft, non-tender and normal bowel sounds MUSCULOSKELETAL:no cyanosis of digits and no clubbing  NEURO: alert & oriented x 3 with fluent speech, no focal motor/sensory deficits EXTREMITIES: No lower extremity edema BREAST: No palpable masses or nodules in either right or left breasts. No palpable axillary supraclavicular or infraclavicular adenopathy no breast tenderness or nipple discharge. (exam performed in the presence of a chaperone)  LABORATORY DATA:  I have reviewed the data as listed   Chemistry      Component Value Date/Time    NA 138 06/12/2015 1522   NA 139 02/12/2015 1410   K 3.9 06/12/2015 1522   K 3.8 02/12/2015 1410   CL 106 06/12/2015 1522   CO2 24 06/12/2015 1522   CO2 24 02/12/2015 1410   BUN 8 06/12/2015 1522   BUN 8.0 02/12/2015 1410   CREATININE 0.71 06/12/2015 1522   CREATININE 0.8 02/12/2015 1410      Component Value Date/Time   CALCIUM 8.6 (L) 06/12/2015 1522   CALCIUM 9.0 02/12/2015 1410   ALKPHOS 61 02/12/2015 1410   AST 19 02/12/2015 1410   ALT 14 02/12/2015 1410   BILITOT 0.63 02/12/2015 1410       Lab Results  Component Value Date   WBC 6.5 06/12/2015   HGB 11.9 (L) 06/12/2015   HCT 37.7 06/12/2015   MCV 84.9 06/12/2015   PLT 184 06/12/2015   NEUTROABS 3.7 06/12/2015    ASSESSMENT & PLAN:  Breast cancer of upper-inner quadrant of left female breast Left breast multifocal invasive ductal carcinoma 0.8 cm and 0.9 cm, ER/PR positive HER-2 negative (ratio is 0.97 and 1.23) Ki-67 ranged 14-16% status post bilateral mastectomies 4 SLN negative.Oncotype DX recurrence score 9, 7 percent  risk of recurrence; currently on tamoxifen X10 years Started 05/26/2015 Due to infections, the breast expanders had to be removed.  Patient had undergone breast reconstruction with latissimus dorsi flap in January 2017. Patient underwent hysterectomy with bilateral salpingo-oophorectomy on 10/17/2015 (discontinued tamoxifen due to uterine thickening)  Breast cancer surveillance: 1. Chest and axillary exams every 6 months: Breast exam 02/29/2016 is without any concerns 2. No role of imaging because she had bilateral mastectomies  B-12 deficiency: currentlyon B-12 injections. plan to continue with B 12 injections since they have improved her energy levels.   02/25/2016 CT Abd/Pelvis: No evidence of metastatic disease. With a 3 cm focus right liver suggestive of benign finding liver MRI recommended. 6 cm solitary right lower lobe lung nodule favor benign recommend CT follow-up in 6 months, bone  scan negative. I reviewed the scans in detail with her. Based on the radiology recommendation, we are obtaining an MRI of the liver and will plan on obtaining a CT chest in 6 months. liver MRI 03/09/16: Liver shows focal nodular hyperplasia  Antiestrogen therapy: Originally patient was on tamoxifen and Wellbutrin bleeding. She underwent hysterectomy and bilateral salpingo-oophorectomy. She was placed on anastrozole. But on anastrozole she could not tolerate because of myalgias and arthralgias. I encouraged her to go back to tamoxifen. (Since she no longer has a uterus). She will start at 10 mg and will increase to 20 mg if she tolerates it.  Return to clinic in 6 months   No orders of the defined types were placed in this encounter.  The patient has a good understanding of the overall plan. she agrees with it. she will call with any problems that may develop before the next visit here.   Rulon Eisenmenger, MD 05/11/16

## 2016-06-08 ENCOUNTER — Ambulatory Visit: Payer: BLUE CROSS/BLUE SHIELD

## 2016-07-04 ENCOUNTER — Other Ambulatory Visit: Payer: Self-pay | Admitting: Emergency Medicine

## 2016-07-04 MED ORDER — CYANOCOBALAMIN 1000 MCG/ML IJ SOLN: 1000.0000 ug | INTRAMUSCULAR | 12 refills | Status: DC

## 2016-07-05 ENCOUNTER — Telehealth: Payer: Self-pay | Admitting: Emergency Medicine

## 2016-07-05 NOTE — Telephone Encounter (Signed)
Patient called to cancel her injection appointment for this week. Patient requested if she could do these injections herself at home. Prescription sent to her preferred pharmacy and all injections canceled.   Patient also concerned about her severe arthritis; which she's unsure if the Tamoxifen is making it worse. Currently patient has stopped the Tamoxifen approximately 2-3 weeks ago.   Patient is inquiring if acupuncture could be tried for these symptoms. She is also asking if taking "vegetable based herbs" from the acupuncturist would be ok while taking the tamoxifen. Will ask Dr Lindi Adie and notify patient of his response. Patient verbalized understanding.

## 2016-07-06 ENCOUNTER — Telehealth: Payer: Self-pay | Admitting: Emergency Medicine

## 2016-07-06 ENCOUNTER — Ambulatory Visit: Payer: BLUE CROSS/BLUE SHIELD

## 2016-07-06 NOTE — Telephone Encounter (Signed)
Spoke with patient; advised her per Dr Lindi Adie that she may have acupuncture and ok to try vegetable based herbs for her arthritis symptoms.   Patient to call for any concerns; encouraged patient to restart Tamoxifen 10mg s when she feels that she can tolerate it. Patient verbalized understanding.

## 2016-08-03 ENCOUNTER — Ambulatory Visit: Payer: BLUE CROSS/BLUE SHIELD

## 2016-08-31 ENCOUNTER — Ambulatory Visit: Payer: BLUE CROSS/BLUE SHIELD

## 2016-09-28 ENCOUNTER — Ambulatory Visit: Payer: BLUE CROSS/BLUE SHIELD

## 2016-10-26 ENCOUNTER — Ambulatory Visit: Payer: BLUE CROSS/BLUE SHIELD

## 2016-11-15 ENCOUNTER — Other Ambulatory Visit: Payer: Self-pay

## 2016-11-15 ENCOUNTER — Telehealth: Payer: Self-pay

## 2016-11-15 DIAGNOSIS — Z171 Estrogen receptor negative status [ER-]: Principal | ICD-10-CM

## 2016-11-15 DIAGNOSIS — C50212 Malignant neoplasm of upper-inner quadrant of left female breast: Secondary | ICD-10-CM

## 2016-11-15 NOTE — Telephone Encounter (Signed)
Received vm from pt regarding requesting 6 month CT follow up on her benign lung nodule. Placed order and scheduled pt for CT chest this week. Called and lvm with time/date and instructions to be liquids only 4hrs prior to exam and placed labs to be done in CT as well. Call back number given with additional questions.

## 2016-11-17 ENCOUNTER — Telehealth: Payer: Self-pay | Admitting: Hematology and Oncology

## 2016-11-17 ENCOUNTER — Other Ambulatory Visit: Payer: Self-pay | Admitting: Hematology and Oncology

## 2016-11-17 ENCOUNTER — Other Ambulatory Visit: Payer: Self-pay

## 2016-11-17 DIAGNOSIS — C50212 Malignant neoplasm of upper-inner quadrant of left female breast: Secondary | ICD-10-CM

## 2016-11-17 DIAGNOSIS — Z171 Estrogen receptor negative status [ER-]: Principal | ICD-10-CM

## 2016-11-17 NOTE — Telephone Encounter (Signed)
Confirmed appointment change with patient °

## 2016-11-18 ENCOUNTER — Ambulatory Visit (HOSPITAL_COMMUNITY)
Admission: RE | Admit: 2016-11-18 | Discharge: 2016-11-18 | Disposition: A | Discharge status: Home / Self Care | Payer: BLUE CROSS/BLUE SHIELD | Source: Ambulatory Visit | Attending: Hematology and Oncology | Admitting: Hematology and Oncology

## 2016-11-18 DIAGNOSIS — Z171 Estrogen receptor negative status [ER-]: Secondary | ICD-10-CM | POA: Diagnosis present

## 2016-11-18 DIAGNOSIS — Z9882 Breast implant status: Secondary | ICD-10-CM | POA: Insufficient documentation

## 2016-11-18 DIAGNOSIS — C50212 Malignant neoplasm of upper-inner quadrant of left female breast: Secondary | ICD-10-CM

## 2016-11-18 DIAGNOSIS — R918 Other nonspecific abnormal finding of lung field: Secondary | ICD-10-CM | POA: Insufficient documentation

## 2016-11-18 DIAGNOSIS — Z9013 Acquired absence of bilateral breasts and nipples: Secondary | ICD-10-CM | POA: Diagnosis not present

## 2016-11-18 MED ORDER — IOPAMIDOL (ISOVUE-300) INJECTION 61%
75.0000 mL | Freq: Once | INTRAVENOUS | Status: AC | PRN
  Administered 2016-11-18: 75 mL via INTRAVENOUS

## 2016-11-18 MED ORDER — IOPAMIDOL (ISOVUE-300) INJECTION 61%
INTRAVENOUS | Status: AC
  Filled 2016-11-18: qty 75

## 2016-11-21 ENCOUNTER — Telehealth: Payer: Self-pay | Admitting: *Deleted

## 2016-11-21 NOTE — Telephone Encounter (Signed)
"  I was told the CT scan performed Friday would be resulted Monday.  I worry so I'm calling to get the results.  Next scheduled F/U is December 06, 2016.  Return number [phone]." Will send request to provider.  Informed her provider due to return 11-28-2016.

## 2016-11-23 ENCOUNTER — Ambulatory Visit: Payer: BLUE CROSS/BLUE SHIELD | Admitting: Hematology and Oncology

## 2016-11-23 ENCOUNTER — Ambulatory Visit: Payer: BLUE CROSS/BLUE SHIELD

## 2016-11-23 NOTE — Telephone Encounter (Signed)
Spoke with patient; advised her that CT scan results report "no evidence of metastatic disease". Patient very appreciative of news and this nurse advised her to follow up as scheduled to discuss in further detail with Dr Lindi Adie. Patient verbalized understanding.

## 2016-12-06 ENCOUNTER — Encounter: Payer: Self-pay | Admitting: Hematology and Oncology

## 2016-12-06 ENCOUNTER — Ambulatory Visit (HOSPITAL_BASED_OUTPATIENT_CLINIC_OR_DEPARTMENT_OTHER): Payer: BLUE CROSS/BLUE SHIELD | Admitting: Hematology and Oncology

## 2016-12-06 VITALS — BP 114/72 | HR 66 | Temp 97.7°F | Resp 18 | Ht 65.0 in | Wt 134.4 lb

## 2016-12-06 DIAGNOSIS — C50212 Malignant neoplasm of upper-inner quadrant of left female breast: Secondary | ICD-10-CM

## 2016-12-06 DIAGNOSIS — Z170 Estrogen receptor positive status [ER+]: Secondary | ICD-10-CM

## 2016-12-06 DIAGNOSIS — R911 Solitary pulmonary nodule: Secondary | ICD-10-CM | POA: Diagnosis not present

## 2016-12-06 NOTE — Progress Notes (Signed)
Patient Care Team: Maisie Fus, MD as PCP - General (Obstetrics and Gynecology)  DIAGNOSIS:  Encounter Diagnoses  Name Primary?  . Malignant neoplasm of upper-inner quadrant of left breast in female, estrogen receptor positive ([place])   . Lung nodule Yes    SUMMARY OF ONCOLOGIC HISTORY:   Breast cancer of upper-inner quadrant of left female breast ([place])   02/25/2014 Initial Biopsy    Left breast mass 11:00: Invasive ductal carcinoma grade 1, ER 100%, PR 99%, Ki-67 14%, HER-2 negative ratio 1.5      03/05/2014 Breast MRI    Left breast 11:00, 10 x 7 x 7 mm, in addition 7 x 5 x 6 mm masslike area, negative lymph nodes      04/11/2014 Surgery    Bilateral mastectomies: Left breast: Multifocal IDC 0.8 cm and 0.9 cm with DCIS 4 SLN negative, bilateral nipple biopsy is negative: Right breast mastectomy no malignancy 2 lymph nodes negative: ER/PR positive HER-2 negative Ki-67 14-16%       Procedure    Genetic testing did not reveal any mutations      05/27/2014 - 10/06/2016 Anti-estrogen oral therapy    Tamoxifen 20 mg daily switched to anastrozole because patient had uterine bleeding; but was not tolerated, switched back to tamoxifen 05/11/2016 stopped due to musculoskeletal side effects      06/17/2015 Surgery    Breast reconstruction with latissimus dorsi muscle flap and an implant      10/20/2015 Surgery    Hysterectomy with bilateral salpingo-oophorectomy      02/25/2016 Imaging    CT Abd/Pelvis: No evidence of metastatic disease. With a 3 cm focus right liver suggestive of benign finding liver MRI recommended. 6 cm solitary right lower lobe lung nodule favor benign recommend CT follow-up in 6 months, bone scan negative      CHIEF COMPLIANT: Stop tamoxifen due to muscle aches and pains  INTERVAL HISTORY: Julie [name] is a 51 year old with above-mentioned history left breast cancer treated with bilateral mastectomies and could not tolerate aromatase inhibitor therapy and  went on tamoxifen. She could not tolerate tamoxifen either. She finally discontinued by herself in March 2018. She had a lot of muscle aches and pains and felt like she was living in an 49 year old body. She had severe stiffness and pain getting up from the chair. In spite of stopping therapy her symptoms continued to persist. She is planning to see orthopedics. She denies any pain lumps or nodules in the reconstructed breast or axilla.  REVIEW OF SYSTEMS:   Constitutional: Denies fevers, chills or abnormal weight loss Eyes: Denies blurriness of vision Ears, nose, mouth, throat, and face: Denies mucositis or sore throat Respiratory: Denies cough, dyspnea or wheezes Cardiovascular: Denies palpitation, chest discomfort Gastrointestinal:  Denies nausea, heartburn or change in bowel habits Skin: Denies abnormal skin rashes Lymphatics: Denies new lymphadenopathy or easy bruising Neurological:Denies numbness, tingling or new weaknesses Behavioral/Psych: Mood is stable, no new changes  Extremities: No lower extremity edema, severe musculoskeletal aches and pains Breast:  denies any pain or lumps or nodules in either breasts All other systems were reviewed with the patient and are negative.  I have reviewed the past medical history, past surgical history, social history and family history with the patient and they are unchanged from previous note.  ALLERGIES:  is allergic to codeine; hydrocodone; and other.  MEDICATIONS:  Current Outpatient Prescriptions  Medication Sig Dispense Refill  . cyanocobalamin (,VITAMIN B-12,) 1000 MCG/ML injection Inject 1 mL (1,000  mcg total) into the muscle every 30 (thirty) days. Please supply patient with syringes and needles 1 mL 12   No current facility-administered medications for this visit.     PHYSICAL EXAMINATION: ECOG PERFORMANCE STATUS: 1 - Symptomatic but completely ambulatory  Vitals:   12/06/16 1141  BP: 114/72  Pulse: 66  Resp: 18  Temp: 97.7  F (36.5 C)   Filed Weights   12/06/16 1141  Weight: 134 lb 6.4 oz (61 kg)    GENERAL:alert, no distress and comfortable SKIN: skin color, texture, turgor are normal, no rashes or significant lesions EYES: normal, Conjunctiva are pink and non-injected, sclera clear OROPHARYNX:no exudate, no erythema and lips, buccal mucosa, and tongue normal  NECK: supple, thyroid normal size, non-tender, without nodularity LYMPH:  no palpable lymphadenopathy in the cervical, axillary or inguinal LUNGS: clear to auscultation and percussion with normal breathing effort HEART: regular rate & rhythm and no murmurs and no lower extremity edema ABDOMEN:abdomen soft, non-tender and normal bowel sounds MUSCULOSKELETAL:no cyanosis of digits and no clubbing  NEURO: alert & oriented x 3 with fluent speech, no focal motor/sensory deficits EXTREMITIES: No lower extremity edema  LABORATORY DATA:  I have reviewed the data as listed   Chemistry      Component Value Date/Time   NA 138 06/12/2015 1522   NA 139 02/12/2015 1410   K 3.9 06/12/2015 1522   K 3.8 02/12/2015 1410   CL 106 06/12/2015 1522   CO2 24 06/12/2015 1522   CO2 24 02/12/2015 1410   BUN 8 06/12/2015 1522   BUN 8.0 02/12/2015 1410   CREATININE 0.71 06/12/2015 1522   CREATININE 0.8 02/12/2015 1410      Component Value Date/Time   CALCIUM 8.6 (L) 06/12/2015 1522   CALCIUM 9.0 02/12/2015 1410   ALKPHOS 61 02/12/2015 1410   AST 19 02/12/2015 1410   ALT 14 02/12/2015 1410   BILITOT 0.63 02/12/2015 1410       Lab Results  Component Value Date   WBC 6.5 06/12/2015   HGB 11.9 (L) 06/12/2015   HCT 37.7 06/12/2015   MCV 84.9 06/12/2015   PLT 184 06/12/2015   NEUTROABS 3.7 06/12/2015    ASSESSMENT & PLAN:  Breast cancer of upper-inner quadrant of left female breast Left breast multifocal invasive ductal carcinoma 0.8 cm and 0.9 cm, ER/PR positive HER-2 negative (ratio is 0.97 and 1.23) Ki-67 ranged 14-16% status post bilateral  mastectomies 4 SLN negative.Oncotype DX recurrence score 9, 7 percent risk of recurrence; currently on tamoxifen X10 years Started 05/26/2015 Due to infections, the breast expanders had to be removed.  Patient had undergone breast reconstruction with latissimus dorsi flap in January 2017. Patient underwent hysterectomy with bilateral salpingo-oophorectomy on 10/17/2015 (discontinued tamoxifen due to uterine thickening)  Breast cancer surveillance: 1. Chest and axillary exams every 6 months: Breast exam 09/25/2017is without any concerns 2. No role of imaging because she had bilateral mastectomies  B-12 deficiency: currentlyon B-12 injections. plan to continue with B 12 injections since they have improved her energy levels.  02/25/2016 CT Abd/Pelvis: No evidence of metastatic disease liver MRI 03/09/16: Liver shows focal nodular hyperplasia  Current treatment: Patient decided to stop tamoxifen therapy. She does not want to take any further antiestrogen therapy because it makes her whole body aches worse. She will continue her follow-ups with me on an annual basis.  I spent 25 minutes talking to the patient of which more than half was spent in counseling and coordination of care.  Orders  Placed This Encounter  Procedures  . CT Chest W Contrast    Standing Status:   Future    Standing Expiration Date:   12/06/2017    Order Specific Question:   If indicated for the ordered procedure, I authorize the administration of contrast media per Radiology protocol    Answer:   Yes    Order Specific Question:   Reason for Exam (SYMPTOM  OR DIAGNOSIS REQUIRED)    Answer:   Lung nodule evaluation with breast cancer history    Order Specific Question:   Is patient pregnant?    Answer:   No    Order Specific Question:   Preferred imaging location?    Answer:   [facility]    Order Specific Question:   Radiology Contrast Protocol - do NOT remove file path    Answer:    [email or web address]   The patient has a good understanding of the overall plan. she agrees with it. she will call with any problems that may develop before the next visit here.   Rulon Eisenmenger, MD 12/06/16

## 2016-12-06 NOTE — Assessment & Plan Note (Signed)
Left breast multifocal invasive ductal carcinoma 0.8 cm and 0.9 cm, ER/PR positive HER-2 negative (ratio is 0.97 and 1.23) Ki-67 ranged 14-16% status post bilateral mastectomies 4 SLN negative.Oncotype DX recurrence score 9, 7 percent risk of recurrence; currently on tamoxifen X10 years Started 05/26/2015 Due to infections, the breast expanders had to be removed.  Patient had undergone breast reconstruction with latissimus dorsi flap in January 2017. Patient underwent hysterectomy with bilateral salpingo-oophorectomy on 10/17/2015 (discontinued tamoxifen due to uterine thickening)  Breast cancer surveillance: 1. Chest and axillary exams every 6 months: Breast exam 09/25/2017is without any concerns 2. No role of imaging because she had bilateral mastectomies  B-12 deficiency: currentlyon B-12 injections. plan to continue with B 12 injections since they have improved her energy levels.   02/25/2016 CT Abd/Pelvis: No evidence of metastatic disease liver MRI 03/09/16: Liver shows focal nodular hyperplasia  Current treatment: Tamoxifen 20 mg daily since she could not tolerate aromatase inhibitor therapy with anastrozole.

## 2017-05-17 ENCOUNTER — Other Ambulatory Visit: Payer: Self-pay | Admitting: General Surgery

## 2017-05-17 ENCOUNTER — Ambulatory Visit
Admission: RE | Admit: 2017-05-17 | Discharge: 2017-05-17 | Disposition: A | Discharge status: Home / Self Care | Payer: BLUE CROSS/BLUE SHIELD | Source: Ambulatory Visit | Attending: General Surgery | Admitting: General Surgery

## 2017-05-17 ENCOUNTER — Other Ambulatory Visit: Payer: BLUE CROSS/BLUE SHIELD

## 2017-05-17 DIAGNOSIS — R221 Localized swelling, mass and lump, neck: Secondary | ICD-10-CM

## 2017-05-17 DIAGNOSIS — R911 Solitary pulmonary nodule: Secondary | ICD-10-CM

## 2017-05-17 MED ORDER — IOPAMIDOL (ISOVUE-300) INJECTION 61%
75.0000 mL | Freq: Once | INTRAVENOUS | Status: AC | PRN
  Administered 2017-05-17: 75 mL via INTRAVENOUS

## 2017-08-08 ENCOUNTER — Telehealth: Payer: Self-pay | Admitting: *Deleted

## 2017-08-08 NOTE — Telephone Encounter (Signed)
Faxed ROI to [employer] 08/08/17 @ 12:18pm, Reference # [id number].

## 2017-10-03 ENCOUNTER — Telehealth: Payer: Self-pay | Admitting: Hematology and Oncology

## 2017-10-03 ENCOUNTER — Other Ambulatory Visit: Payer: Self-pay

## 2017-10-03 MED ORDER — CYANOCOBALAMIN 1000 MCG/ML IJ SOLN: 1000.0000 ug | INTRAMUSCULAR | 12 refills | Status: DC

## 2017-10-03 NOTE — Telephone Encounter (Signed)
Called patient regarding 7/3 °

## 2017-10-03 NOTE — Telephone Encounter (Signed)
Called pt to inform her of refill request sent.  Cyndia Bent RN

## 2017-11-03 ENCOUNTER — Telehealth: Payer: Self-pay | Admitting: Hematology and Oncology

## 2017-11-03 NOTE — Telephone Encounter (Signed)
Spoke w/ pt.  Resch appt with VG to the morning @ 9:30 AM.

## 2017-11-09 ENCOUNTER — Other Ambulatory Visit: Payer: Self-pay | Admitting: General Surgery

## 2017-11-09 DIAGNOSIS — R5381 Other malaise: Secondary | ICD-10-CM

## 2017-11-09 DIAGNOSIS — N63 Unspecified lump in unspecified breast: Secondary | ICD-10-CM

## 2017-11-13 ENCOUNTER — Other Ambulatory Visit: Payer: Self-pay | Admitting: General Surgery

## 2017-11-13 DIAGNOSIS — R109 Unspecified abdominal pain: Secondary | ICD-10-CM

## 2017-11-21 ENCOUNTER — Ambulatory Visit
Admission: RE | Admit: 2017-11-21 | Discharge: 2017-11-21 | Disposition: A | Discharge status: Home / Self Care | Payer: BLUE CROSS/BLUE SHIELD | Source: Ambulatory Visit | Attending: General Surgery | Admitting: General Surgery

## 2017-11-21 DIAGNOSIS — N63 Unspecified lump in unspecified breast: Secondary | ICD-10-CM

## 2017-11-22 ENCOUNTER — Ambulatory Visit
Admission: RE | Admit: 2017-11-22 | Discharge: 2017-11-22 | Disposition: A | Discharge status: Home / Self Care | Payer: BLUE CROSS/BLUE SHIELD | Source: Ambulatory Visit | Attending: General Surgery | Admitting: General Surgery

## 2017-11-22 DIAGNOSIS — R109 Unspecified abdominal pain: Secondary | ICD-10-CM

## 2017-11-22 MED ORDER — IOHEXOL 300 MG/ML  SOLN
100.0000 mL | Freq: Once | INTRAMUSCULAR | Status: AC | PRN
  Administered 2017-11-22: 100 mL via INTRAVENOUS

## 2017-12-06 ENCOUNTER — Telehealth: Payer: Self-pay | Admitting: Hematology and Oncology

## 2017-12-06 ENCOUNTER — Inpatient Hospital Stay: Payer: BLUE CROSS/BLUE SHIELD | Attending: Hematology and Oncology | Admitting: Hematology and Oncology

## 2017-12-06 DIAGNOSIS — E538 Deficiency of other specified B group vitamins: Secondary | ICD-10-CM | POA: Diagnosis not present

## 2017-12-06 DIAGNOSIS — Z170 Estrogen receptor positive status [ER+]: Secondary | ICD-10-CM

## 2017-12-06 DIAGNOSIS — Z90710 Acquired absence of both cervix and uterus: Secondary | ICD-10-CM

## 2017-12-06 DIAGNOSIS — Z9013 Acquired absence of bilateral breasts and nipples: Secondary | ICD-10-CM

## 2017-12-06 DIAGNOSIS — Z79899 Other long term (current) drug therapy: Secondary | ICD-10-CM | POA: Diagnosis not present

## 2017-12-06 DIAGNOSIS — Z90722 Acquired absence of ovaries, bilateral: Secondary | ICD-10-CM

## 2017-12-06 DIAGNOSIS — Z853 Personal history of malignant neoplasm of breast: Secondary | ICD-10-CM

## 2017-12-06 DIAGNOSIS — C50212 Malignant neoplasm of upper-inner quadrant of left female breast: Secondary | ICD-10-CM

## 2017-12-06 MED ORDER — FLUOXETINE HCL 20 MG PO CAPS: 20.0000 mg | ORAL_CAPSULE | Freq: Every day | ORAL | 3 refills | Status: AC

## 2017-12-06 MED ORDER — CYANOCOBALAMIN 1000 MCG/ML IJ SOLN: 1000.0000 ug | INTRAMUSCULAR | 3 refills | Status: AC

## 2017-12-06 MED ORDER — SYRINGE (DISPOSABLE) 1 ML MISC: 1.0000 | 3 refills | Status: AC

## 2017-12-06 NOTE — Assessment & Plan Note (Signed)
Left breast multifocal invasive ductal carcinoma 0.8 cm and 0.9 cm, ER/PR positive HER-2 negative (ratio is 0.97 and 1.23) Ki-67 ranged 14-16% status post bilateral mastectomies 4 SLN negative.Oncotype DX recurrence score 9, 7 percent risk of recurrence; currently on tamoxifen X10 years Started 05/26/2015 Due to infections, the breast expanders had to be removed.  Patient had undergone breast reconstruction with latissimus dorsi flap in January 2017. Patient underwent hysterectomy with bilateral salpingo-oophorectomy on 10/17/2015 (discontinued tamoxifen due to uterine thickening)  Breast cancer surveillance: 1. Chest and axillary exams every 6 months: Breast exam 09/25/2017is without any concerns 2. No role of imaging because she had bilateral mastectomies  B-12 deficiency: Currently on B12 injections Surveillance 02/25/2016 CT Abd/Pelvis: No evidence of metastatic disease liver MRI 03/09/16: Liver shows focal nodular hyperplasia  Current treatment:  Patient stopped tamoxifen therapy because of aches and pains.  Breast cancer surveillance: 1.  Breast exam 12/06/2017: Benign 2. no role of mammograms because she had bilateral mastectomies with reconstruction  Return to clinic in 1 year with survivorship

## 2017-12-06 NOTE — Progress Notes (Signed)
Patient Care Team: Maisie Fus, MD as PCP - General (Obstetrics and Gynecology)  DIAGNOSIS:  Encounter Diagnosis  Name Primary?  . Malignant neoplasm of upper-inner quadrant of left breast in female, estrogen receptor positive ([place])     SUMMARY OF ONCOLOGIC HISTORY:   Breast cancer of upper-inner quadrant of left female breast ([place])   02/25/2014 Initial Biopsy    Left breast mass 11:00: Invasive ductal carcinoma grade 1, ER 100%, PR 99%, Ki-67 14%, HER-2 negative ratio 1.5      03/05/2014 Breast MRI    Left breast 11:00, 10 x 7 x 7 mm, in addition 7 x 5 x 6 mm masslike area, negative lymph nodes      04/11/2014 Surgery    Bilateral mastectomies: Left breast: Multifocal IDC 0.8 cm and 0.9 cm with DCIS 4 SLN negative, bilateral nipple biopsy is negative: Right breast mastectomy no malignancy 2 lymph nodes negative: ER/PR positive HER-2 negative Ki-67 14-16%       Procedure    Genetic testing did not reveal any mutations      05/27/2014 - 10/06/2016 Anti-estrogen oral therapy    Tamoxifen 20 mg daily switched to anastrozole because patient had uterine bleeding; but was not tolerated, switched back to tamoxifen 05/11/2016 stopped due to musculoskeletal side effects      06/17/2015 Surgery    Breast reconstruction with latissimus dorsi muscle flap and an implant      10/20/2015 Surgery    Hysterectomy with bilateral salpingo-oophorectomy      02/25/2016 Imaging    CT Abd/Pelvis: No evidence of metastatic disease. With a 3 cm focus right liver suggestive of benign finding liver MRI recommended. 6 cm solitary right lower lobe lung nodule favor benign recommend CT follow-up in 6 months, bone scan negative       CHIEF COMPLIANT: Annual follow-up of breast cancer  INTERVAL HISTORY: Julie [name] is a 52 year old with above-mentioned history of multifocal left breast cancer who underwent bilateral mastectomies and reconstruction.  She discontinued tamoxifen therapy because of  uterine thickening as well as muscle aches and pains.  Is here for annual follow-up.  She is also on B12 replacement therapy for B12 deficiency.  She denies any lumps or nodules or any other concerns.  REVIEW OF SYSTEMS:   Constitutional: Denies fevers, chills or abnormal weight loss Eyes: Denies blurriness of vision Ears, nose, mouth, throat, and face: Denies mucositis or sore throat Respiratory: Denies cough, dyspnea or wheezes Cardiovascular: Denies palpitation, chest discomfort Gastrointestinal:  Denies nausea, heartburn or change in bowel habits Skin: Denies abnormal skin rashes Lymphatics: Denies new lymphadenopathy or easy bruising Neurological:Denies numbness, tingling or new weaknesses Behavioral/Psych: Mood is stable, no new changes  Extremities: No lower extremity edema Breast:  denies any pain or lumps or nodules in either reconstructed breasts All other systems were reviewed with the patient and are negative.  I have reviewed the past medical history, past surgical history, social history and family history with the patient and they are unchanged from previous note.  ALLERGIES:  is allergic to codeine; hydrocodone; and other.  MEDICATIONS:  Current Outpatient Medications  Medication Sig Dispense Refill  . cyanocobalamin (,VITAMIN B-12,) 1000 MCG/ML injection Inject 1 mL (1,000 mcg total) into the muscle every 30 (thirty) days. Please supply patient with syringes and needles 3 mL 3  . FLUoxetine (PROZAC) 20 MG capsule Take 1 capsule (20 mg total) by mouth daily.  3  . Syringe, Disposable, 1 ML MISC 1 Syringe by  Does not apply route every 30 (thirty) days. 3 each 3   No current facility-administered medications for this visit.     PHYSICAL EXAMINATION: ECOG PERFORMANCE STATUS: 1 - Symptomatic but completely ambulatory  Vitals:   12/06/17 0956  BP: 105/77  Pulse: 71  Resp: 18  Temp: 98.3 F (36.8 C)  SpO2: 100%   Filed Weights   12/06/17 0956  Weight: 133 lb  12.8 oz (60.7 kg)    GENERAL:alert, no distress and comfortable SKIN: skin color, texture, turgor are normal, no rashes or significant lesions EYES: normal, Conjunctiva are pink and non-injected, sclera clear OROPHARYNX:no exudate, no erythema and lips, buccal mucosa, and tongue normal  NECK: supple, thyroid normal size, non-tender, without nodularity LYMPH:  no palpable lymphadenopathy in the cervical, axillary or inguinal LUNGS: clear to auscultation and percussion with normal breathing effort HEART: regular rate & rhythm and no murmurs and no lower extremity edema ABDOMEN:abdomen soft, non-tender and normal bowel sounds MUSCULOSKELETAL:no cyanosis of digits and no clubbing  NEURO: alert & oriented x 3 with fluent speech, no focal motor/sensory deficits EXTREMITIES: No lower extremity edema  LABORATORY DATA:  I have reviewed the data as listed CMP Latest Ref Rng & Units 06/12/2015 02/12/2015 05/07/2014  Glucose 65 - 99 mg/dL 75 98 83  BUN 6 - 20 mg/dL 8 8.0 8  Creatinine 0.44 - 1.00 mg/dL 0.71 0.8 0.85  Sodium 135 - 145 mmol/L 138 139 140  Potassium 3.5 - 5.1 mmol/L 3.9 3.8 4.2  Chloride 101 - 111 mmol/L 106 - 106  CO2 22 - 32 mmol/L '24 24 23  ' Calcium 8.9 - 10.3 mg/dL 8.6(L) 9.0 8.5  Total Protein 6.4 - 8.3 g/dL - 6.8 -  Total Bilirubin 0.20 - 1.20 mg/dL - 0.63 -  Alkaline Phos 40 - 150 U/L - 61 -  AST 5 - 34 U/L - 19 -  ALT 0 - 55 U/L - 14 -    Lab Results  Component Value Date   WBC 6.5 06/12/2015   HGB 11.9 (L) 06/12/2015   HCT 37.7 06/12/2015   MCV 84.9 06/12/2015   PLT 184 06/12/2015   NEUTROABS 3.7 06/12/2015    ASSESSMENT & PLAN:  Breast cancer of upper-inner quadrant of left female breast Left breast multifocal invasive ductal carcinoma 0.8 cm and 0.9 cm, ER/PR positive HER-2 negative (ratio is 0.97 and 1.23) Ki-67 ranged 14-16% status post bilateral mastectomies 4 SLN negative.Oncotype DX recurrence score 9, 7 percent risk of recurrence; currently on tamoxifen X10  years Started 05/26/2015 Due to infections, the breast expanders had to be removed.  Patient had undergone breast reconstruction with latissimus dorsi flap in January 2017. Patient underwent hysterectomy with bilateral salpingo-oophorectomy on 10/17/2015 (discontinued tamoxifen due to uterine thickening)  Breast cancer surveillance: 1. Chest and axillary exams every 6 months: Breast exam 09/25/2017is without any concerns 2. No role of imaging because she had bilateral mastectomies  B-12 deficiency: Currently on B12 injections. I sent a new prescription for B12 injections Surveillance 02/25/2016 CT Abd/Pelvis: No evidence of metastatic disease liver MRI 03/09/16: Liver shows focal nodular hyperplasia  Current treatment:  Patient stopped tamoxifen therapy because of aches and pains.  Breast cancer surveillance: 1.  Breast exam 12/06/2017: Benign 2. no role of mammograms because she had bilateral mastectomies with reconstruction Patient stays busy going to the beach.  They are trying to sell the [facility] property and then purchase another property in [place]. Return to clinic in 1 year with me  No orders of the defined types were placed in this encounter.  The patient has a good understanding of the overall plan. she agrees with it. she will call with any problems that may develop before the next visit here.   Harriette Ohara, MD 12/06/17

## 2017-12-06 NOTE — Telephone Encounter (Signed)
Gave patient avs and calendar of upcoming July 2020 appts.  °

## 2018-05-31 ENCOUNTER — Telehealth: Payer: Self-pay | Admitting: Hematology and Oncology

## 2018-05-31 NOTE — Telephone Encounter (Signed)
Called and spoke with patient per 12/2 scheduling voicemail log. Patient stated she is changing insurance and will be going to a different provider within her network and will no longer be needing our services.

## 2018-09-21 DIAGNOSIS — K5792 Diverticulitis of intestine, part unspecified, without perforation or abscess without bleeding: Secondary | ICD-10-CM | POA: Diagnosis not present

## 2018-09-21 DIAGNOSIS — F419 Anxiety disorder, unspecified: Secondary | ICD-10-CM

## 2018-09-22 DIAGNOSIS — K5792 Diverticulitis of intestine, part unspecified, without perforation or abscess without bleeding: Secondary | ICD-10-CM | POA: Diagnosis not present

## 2018-09-22 DIAGNOSIS — C50919 Malignant neoplasm of unspecified site of unspecified female breast: Secondary | ICD-10-CM | POA: Diagnosis not present

## 2018-09-22 DIAGNOSIS — Z789 Other specified health status: Secondary | ICD-10-CM

## 2018-09-22 DIAGNOSIS — F419 Anxiety disorder, unspecified: Secondary | ICD-10-CM | POA: Diagnosis not present

## 2018-09-23 DIAGNOSIS — C50919 Malignant neoplasm of unspecified site of unspecified female breast: Secondary | ICD-10-CM | POA: Diagnosis not present

## 2018-09-23 DIAGNOSIS — F419 Anxiety disorder, unspecified: Secondary | ICD-10-CM | POA: Diagnosis not present

## 2018-09-23 DIAGNOSIS — K5792 Diverticulitis of intestine, part unspecified, without perforation or abscess without bleeding: Secondary | ICD-10-CM | POA: Diagnosis not present

## 2018-09-23 DIAGNOSIS — Z789 Other specified health status: Secondary | ICD-10-CM | POA: Diagnosis not present

## 2018-11-25 IMAGING — CT CT CHEST W/ CM
2 of 3 series · 15 of 36 positions shown, 18 images · IV contrast (iopamidol)
Comparison: MR abdomen 03/09/2016 and CT chest abdomen pelvis
02/24/2016.

CLINICAL DATA: Left breast cancer.

EXAM:
CT CHEST WITH CONTRAST
TECHNIQUE: Multidetector CT imaging of the chest was performed during
intravenous contrast administration.
CONTRAST:  75mL TYU0ZW-PDD IOPAMIDOL (TYU0ZW-PDD) INJECTION 61%

[Series 2: axial st · axial · 0.70mm/px · z∈[-348,-52]mm · 12 of 174 slices shown, 15 images]
[im 13/174  mediastinal]
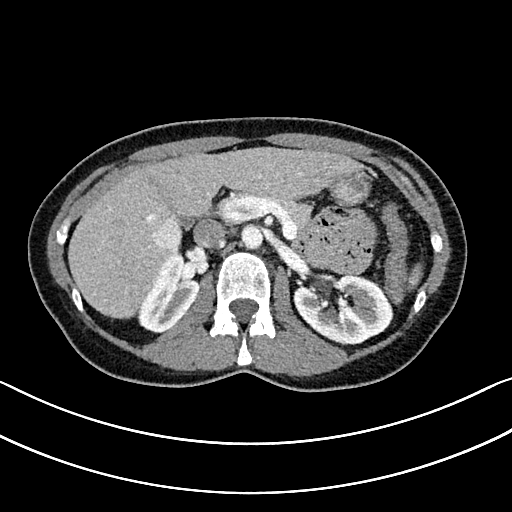
[im 13/174  lung]
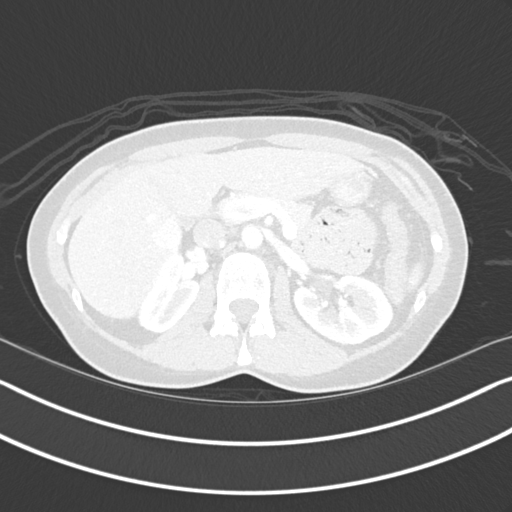
[im 26/174  lung]
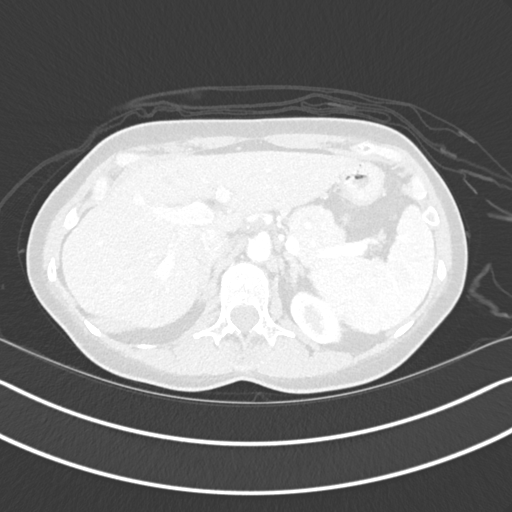
[im 39/174  lung]
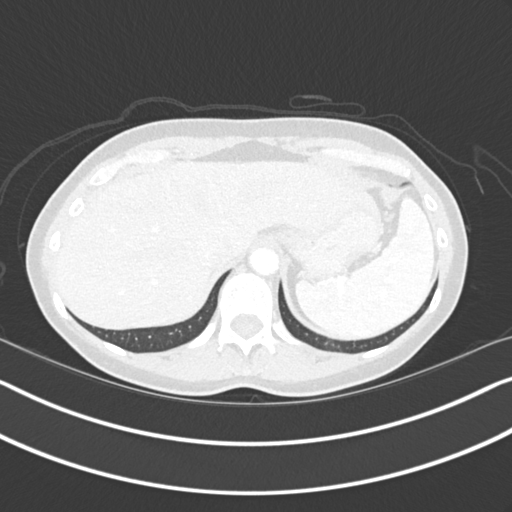
[im 52/174  lung]
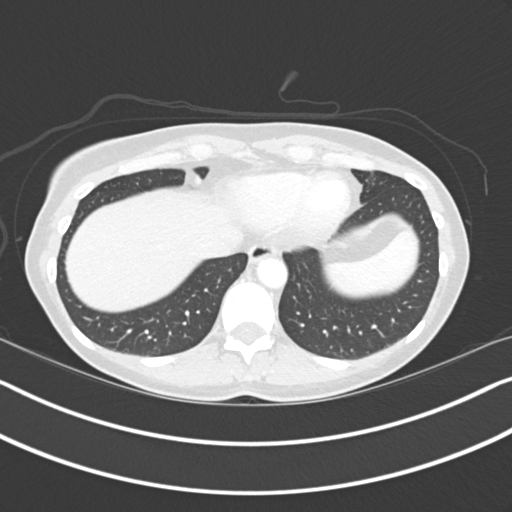
[im 65/174  mediastinal]
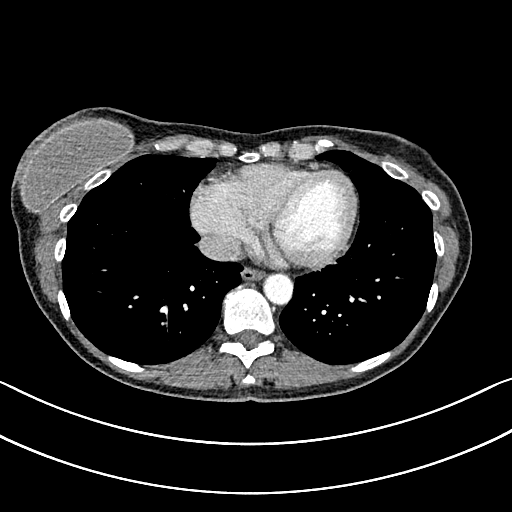
[im 65/174  lung]
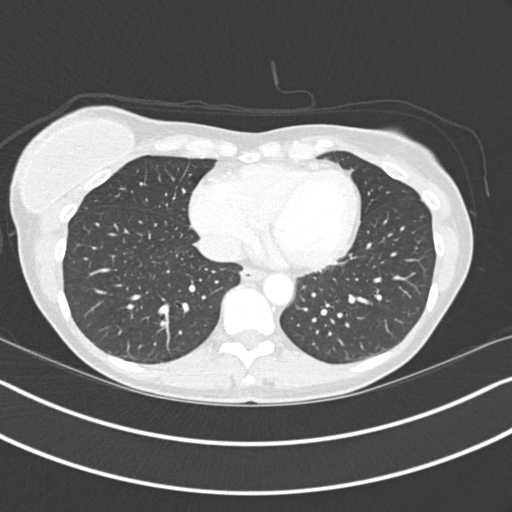
[im 77/174  lung]
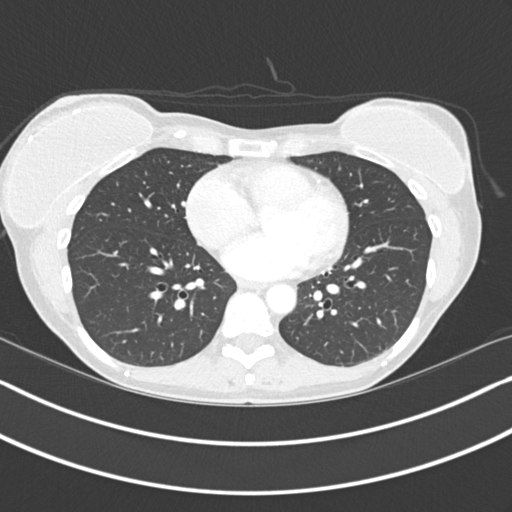
[im 97/174  lung]
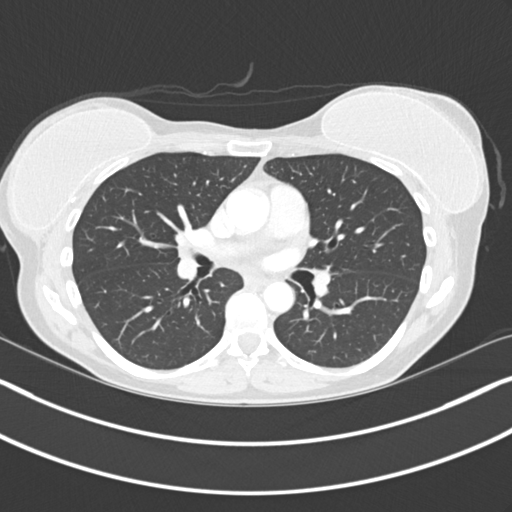
[im 109/174  lung]
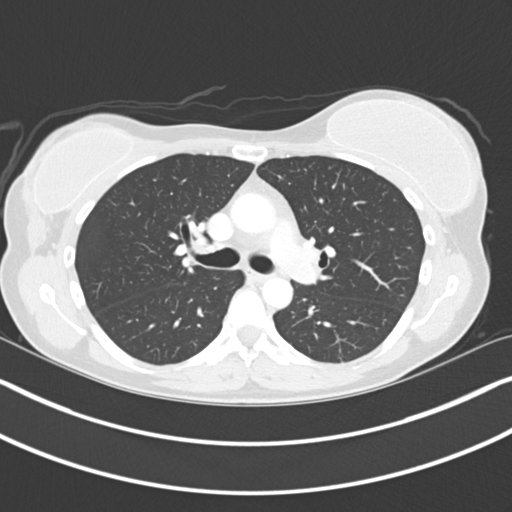
[im 122/174  mediastinal]
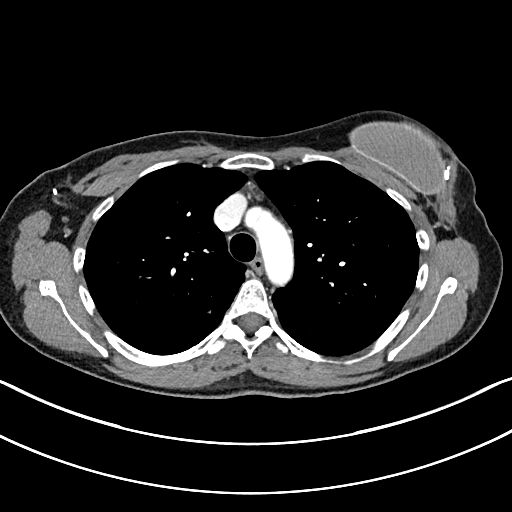
[im 122/174  lung]
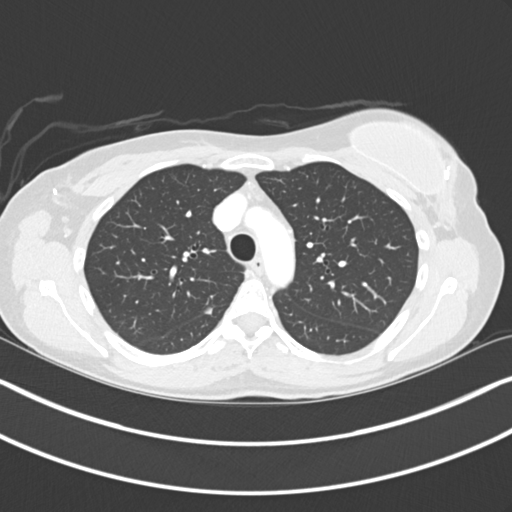
[im 135/174  lung]
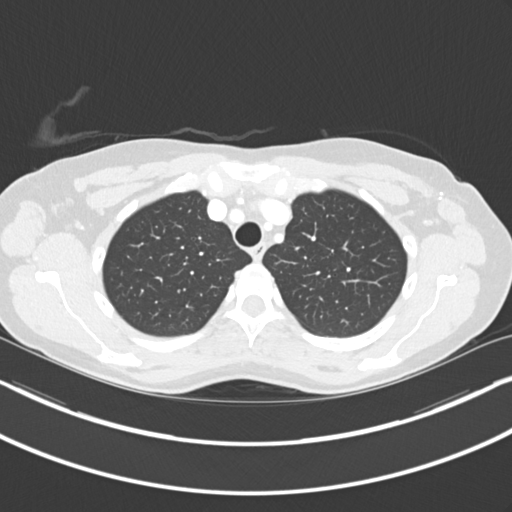
[im 148/174  lung]
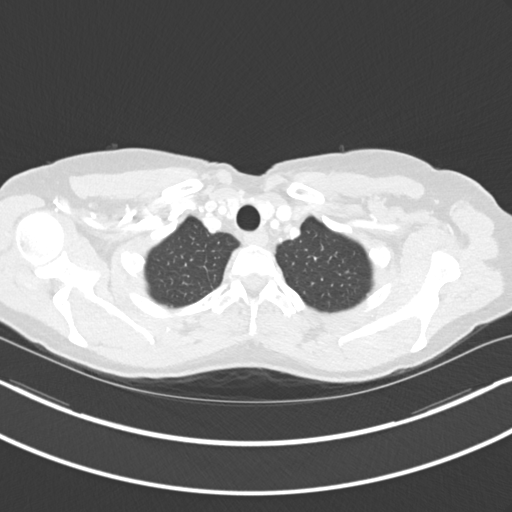
[im 161/174  lung]
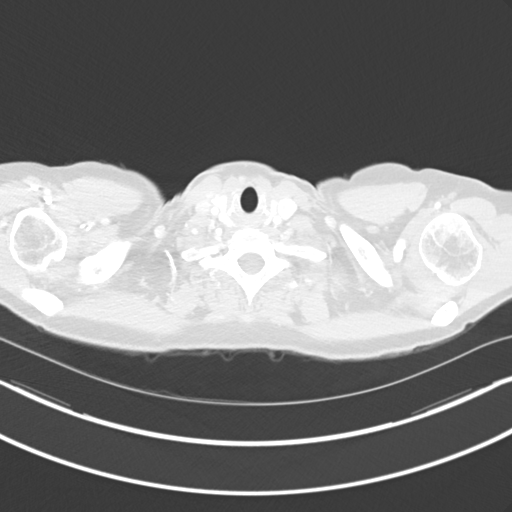

[Series 5: coronal · coronal · 0.70mm/px · 3 of 128 slices shown]
[im 26/128  lung]
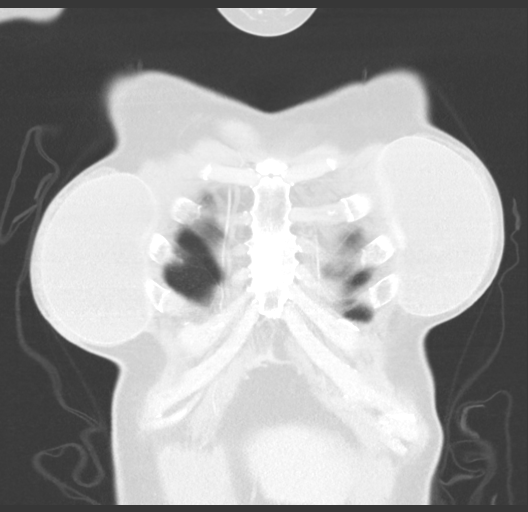
[im 51/128  lung]
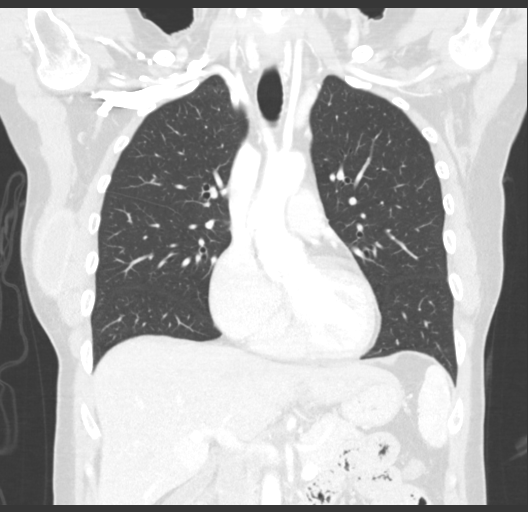
[im 77/128  lung]
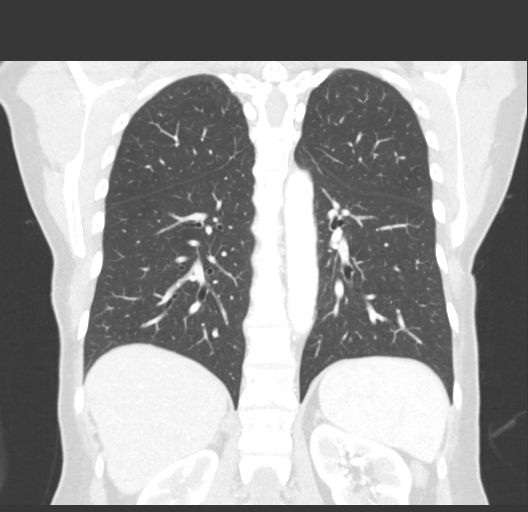

[15 of 36 positions shown; findings below may reference images not displayed]

FINDINGS: Cardiovascular: Vascular structures are unremarkable. Heart size
normal. No pericardial effusion.

Mediastinum/Nodes: Somewhat thymic tissue in the prevascular space.
No pathologically enlarged mediastinal, hilar or internal mammary
lymph nodes. Surgical clips in left axilla. Esophagus is grossly
unremarkable.

Lungs/Pleura: Subpleural nodules along the right major fissure
measure up to 6 mm, stable. Lungs are otherwise clear. No pleural
fluid. Airway is unremarkable.

Upper Abdomen: Subcentimeter low-attenuation lesion in the right
hepatic lobe is too small to characterize. 2.7 cm hyperattenuating
lesion in the right hepatic lobe is unchanged. Visualized portions
of the gallbladder, adrenal glands, kidneys, spleen, pancreas,
stomach and bowel are grossly unremarkable. No upper abdominal
adenopathy.

Musculoskeletal: Mild degenerative changes in the spine. No
worrisome lytic or sclerotic lesions. Bilateral mastectomies with
prostheses.
IMPRESSION: 1. No evidence of metastatic disease.
2. Hyperattenuating right hepatic lobe lesion, characterized as
focal nodular hyperplasia on 03/09/2016.
3. Subpleural nodules along the right major fissure, stable and most
likely benign.

## 2018-12-11 ENCOUNTER — Ambulatory Visit: Payer: BLUE CROSS/BLUE SHIELD | Admitting: Hematology and Oncology

## 2018-12-19 ENCOUNTER — Other Ambulatory Visit: Payer: Self-pay | Admitting: Hematology and Oncology
